# Patient Record
Sex: Female | Born: 1940 | Race: White | Hispanic: No | Marital: Married | Smoking: Former smoker
Health system: Southern US, Community
[De-identification: ages and names within clinical notes are randomized; demographics above are authoritative.]

## PROBLEM LIST (undated history)

## (undated) DIAGNOSIS — I10 Essential (primary) hypertension: Secondary | ICD-10-CM

## (undated) DIAGNOSIS — E785 Hyperlipidemia, unspecified: Secondary | ICD-10-CM

## (undated) DIAGNOSIS — R011 Cardiac murmur, unspecified: Secondary | ICD-10-CM

## (undated) DIAGNOSIS — I6529 Occlusion and stenosis of unspecified carotid artery: Secondary | ICD-10-CM

## (undated) DIAGNOSIS — K5790 Diverticulosis of intestine, part unspecified, without perforation or abscess without bleeding: Secondary | ICD-10-CM

## (undated) DIAGNOSIS — M1990 Unspecified osteoarthritis, unspecified site: Secondary | ICD-10-CM

## (undated) HISTORY — DX: Essential (primary) hypertension: I10

## (undated) HISTORY — DX: Unspecified osteoarthritis, unspecified site: M19.90

## (undated) HISTORY — DX: Cardiac murmur, unspecified: R01.1

## (undated) HISTORY — DX: Hyperlipidemia, unspecified: E78.5

## (undated) HISTORY — DX: Diverticulosis of intestine, part unspecified, without perforation or abscess without bleeding: K57.90

## (undated) HISTORY — DX: Occlusion and stenosis of unspecified carotid artery: I65.29

---

## 1980-04-09 HISTORY — PX: ABDOMINAL HYSTERECTOMY: SHX81

## 2001-04-16 ENCOUNTER — Other Ambulatory Visit: Admission: RE | Admit: 2001-04-16 | Discharge: 2001-04-16 | Payer: Self-pay | Admitting: Obstetrics and Gynecology

## 2003-11-29 ENCOUNTER — Ambulatory Visit (HOSPITAL_COMMUNITY): Admission: RE | Admit: 2003-11-29 | Discharge: 2003-11-29 | Payer: Self-pay | Admitting: Gastroenterology

## 2004-10-13 ENCOUNTER — Inpatient Hospital Stay (HOSPITAL_COMMUNITY): Admission: EM | Admit: 2004-10-13 | Discharge: 2004-10-14 | Payer: Self-pay | Admitting: Emergency Medicine

## 2006-04-09 HISTORY — PX: APPENDECTOMY: SHX54

## 2011-02-19 ENCOUNTER — Other Ambulatory Visit: Payer: Self-pay | Admitting: Family Medicine

## 2013-12-08 ENCOUNTER — Other Ambulatory Visit: Payer: Self-pay | Admitting: Family Medicine

## 2013-12-08 DIAGNOSIS — R221 Localized swelling, mass and lump, neck: Secondary | ICD-10-CM

## 2013-12-09 ENCOUNTER — Ambulatory Visit
Admission: RE | Admit: 2013-12-09 | Discharge: 2013-12-09 | Disposition: A | Discharge status: Home / Self Care | Payer: Medicare Other | Source: Ambulatory Visit | Attending: Family Medicine | Admitting: Family Medicine

## 2013-12-09 DIAGNOSIS — R221 Localized swelling, mass and lump, neck: Secondary | ICD-10-CM

## 2013-12-09 MED ORDER — IOHEXOL 300 MG/ML  SOLN: 75.0000 mL | Freq: Once | INTRAMUSCULAR | Status: AC | PRN

## 2013-12-17 ENCOUNTER — Other Ambulatory Visit: Payer: Self-pay | Admitting: Family Medicine

## 2013-12-17 DIAGNOSIS — I6522 Occlusion and stenosis of left carotid artery: Secondary | ICD-10-CM

## 2013-12-25 ENCOUNTER — Encounter (INDEPENDENT_AMBULATORY_CARE_PROVIDER_SITE_OTHER): Payer: Self-pay

## 2013-12-25 ENCOUNTER — Ambulatory Visit
Admission: RE | Admit: 2013-12-25 | Discharge: 2013-12-25 | Disposition: A | Discharge status: Home / Self Care | Payer: Medicare Other | Source: Ambulatory Visit | Attending: Family Medicine | Admitting: Family Medicine

## 2013-12-25 DIAGNOSIS — I6522 Occlusion and stenosis of left carotid artery: Secondary | ICD-10-CM

## 2014-01-04 ENCOUNTER — Encounter: Payer: Self-pay | Admitting: Surgery

## 2014-01-22 ENCOUNTER — Encounter: Payer: Self-pay | Admitting: Surgery

## 2014-01-25 ENCOUNTER — Encounter: Payer: Self-pay | Admitting: Surgery

## 2014-01-25 ENCOUNTER — Ambulatory Visit (INDEPENDENT_AMBULATORY_CARE_PROVIDER_SITE_OTHER): Payer: Medicare Other | Admitting: Surgery

## 2014-01-25 VITALS — BP 128/66 | HR 72 | Ht 67.0 in | Wt 154.0 lb

## 2014-01-25 DIAGNOSIS — I6529 Occlusion and stenosis of unspecified carotid artery: Secondary | ICD-10-CM

## 2014-01-25 NOTE — Addendum Note (Signed)
Addended by: Mena Goes on: 01/25/2014 01:38 PM   Modules accepted: Orders

## 2014-01-25 NOTE — Progress Notes (Signed)
Patient name: Rachel Bowers MRN: [id number] DOB: 1941/02/15 Sex: female   Referred by: Dr. Drema Dallas  Reason for referral:  Chief Complaint  Patient presents with  . New Evaluation    carotid stenosis    HISTORY OF PRESENT ILLNESS: This is a very pleasant 73 year old female who was referred today for evaluation of left carotid stenosis.  Several months ago the patient felt a lump in her right neck.  During her workup she had a CT scan which showed left carotid calcification.  This led to a carotid duplex.  She was found to have velocity elevations consistent with a 50-69% left carotid stenosis.  There was only trace plaque without stenosis in the right carotid artery.  The patient is asymptomatic.  Specifically, she denies numbness or weakness in either extremity.  She denies slurred speech.  She denies amaurosis fugax.  She does have a history of smoking but quit in the 1970s.  There is a family history of premature cardiac death in her mother who died at age 40 of a massive heart attack.  The patient is medically managed for hypercholesterolemia with a statin.  She is on a ARB for hypertension.  Past Medical History  Diagnosis Date  . Hyperlipidemia   . Hypertension   . Heart murmur     Past Surgical History  Procedure Laterality Date  . Abdominal hysterectomy  1982    History   Social History  . Marital Status: Married    Spouse Name: N/A    Number of Children: N/A  . Years of Education: N/A   Occupational History  . Not on file.   Social History Main Topics  . Smoking status: Former Smoker    Types: Cigarettes    Quit date: 01/25/1974  . Smokeless tobacco: Not on file  . Alcohol Use: 0.6 oz/week    1 Glasses of wine per week  . Drug Use: No  . Sexual Activity: Not on file   Other Topics Concern  . Not on file   Social History Narrative  . No narrative on file    Family History  Problem Relation Age of Onset  . Heart disease Mother   . Hyperlipidemia  Mother   . Hypertension Mother   . Heart attack Mother   . Cancer Brother     Allergies as of 01/25/2014 - Review Complete 01/25/2014  Allergen Reaction Noted  . Penicillins  01/04/2014    Current Outpatient Prescriptions on File Prior to Visit  Medication Sig Dispense Refill  . alendronate (FOSAMAX) 70 MG tablet Take 1 tablet by mouth once a week.      Marland Kitchen amLODipine (NORVASC) 5 MG tablet Take 5 mg by mouth daily.      Marland Kitchen aspirin 81 MG tablet Take 81 mg by mouth daily.      . fluticasone (FLONASE) 50 MCG/ACT nasal spray Place 2 sprays into both nostrils daily.      Marland Kitchen loratadine (CLARITIN) 10 MG tablet Take 10 mg by mouth daily.      Marland Kitchen losartan (COZAAR) 50 MG tablet Take 50 mg by mouth daily.      Marland Kitchen lovastatin (MEVACOR) 20 MG tablet Take 20 mg by mouth at bedtime.      . Omega-3 Fatty Acids (FISH OIL) 1000 MG CAPS Take 1 capsule by mouth daily.       No current facility-administered medications on file prior to visit.     REVIEW OF SYSTEMS: Cardiovascular: No chest  pain, chest pressure, palpitations, orthopnea, or dyspnea on exertion. No claudication or rest pain,  No history of DVT or phlebitis. Pulmonary: No productive cough, asthma or wheezing. Neurologic: No weakness, paresthesias, aphasia, or amaurosis. No dizziness. Hematologic: No bleeding problems or clotting disorders. Musculoskeletal: No joint pain or joint swelling. Gastrointestinal: No blood in stool or hematemesis Genitourinary: No dysuria or hematuria. Psychiatric:: No history of major depression. Integumentary: No rashes or ulcers. Constitutional: No fever or chills.  PHYSICAL EXAMINATION: General: The patient appears their stated age.  Vital signs are BP 128/66  Pulse 72  Ht 5\' 7"  (1.702 m)  Wt 154 lb (69.854 kg)  BMI 24.11 kg/m2  SpO2 99% HEENT:  No gross abnormalities.  Palpable lymph node in the right posterior fossa Pulmonary: Respirations are non-labored Abdomen: Soft and non-tender  Musculoskeletal:  There are no major deformities.   Neurologic: No focal weakness or paresthesias are detected, Skin: There are no ulcer or rashes noted. Psychiatric: The patient has normal affect. Cardiovascular: There is a regular rate and rhythm without significant murmur appreciated.  No carotid bruits.  Palpable pedal pulses.  Diagnostic Studies: I have reviewed her ultrasound which shows no significant right carotid stenosis and 50-69% left carotid stenosis   Assessment:  Left carotid stenosis, asymptomatic Plan: I discussed the signs and symptoms of a TIA vs. stroke.  The patient is asymptomatic.  She will contact me if she develops symptoms.  Otherwise, I'll schedule her for followup in one year with a repeat carotid ultrasound.  She would benefit from a baby aspirin daily.     Eldridge Abrahams, M.D. [facility] Office: [phone] Pager:  [phone]

## 2014-04-28 ENCOUNTER — Other Ambulatory Visit: Payer: Self-pay | Admitting: Obstetrics & Gynecology

## 2015-01-28 ENCOUNTER — Encounter: Payer: Self-pay | Admitting: Family

## 2015-01-31 ENCOUNTER — Ambulatory Visit (HOSPITAL_COMMUNITY)
Admission: RE | Admit: 2015-01-31 | Discharge: 2015-01-31 | Disposition: A | Discharge status: Home / Self Care | Payer: Medicare Other | Source: Ambulatory Visit | Attending: Family | Admitting: Family

## 2015-01-31 ENCOUNTER — Ambulatory Visit (INDEPENDENT_AMBULATORY_CARE_PROVIDER_SITE_OTHER): Payer: Medicare Other | Admitting: Family

## 2015-01-31 ENCOUNTER — Encounter: Payer: Self-pay | Admitting: Family

## 2015-01-31 VITALS — BP 120/73 | HR 73 | Temp 97.2°F | Resp 16 | Ht 66.5 in | Wt 151.0 lb

## 2015-01-31 DIAGNOSIS — I6529 Occlusion and stenosis of unspecified carotid artery: Secondary | ICD-10-CM | POA: Diagnosis not present

## 2015-01-31 DIAGNOSIS — E785 Hyperlipidemia, unspecified: Secondary | ICD-10-CM | POA: Insufficient documentation

## 2015-01-31 DIAGNOSIS — I6523 Occlusion and stenosis of bilateral carotid arteries: Secondary | ICD-10-CM | POA: Insufficient documentation

## 2015-01-31 DIAGNOSIS — I10 Essential (primary) hypertension: Secondary | ICD-10-CM | POA: Insufficient documentation

## 2015-01-31 NOTE — Progress Notes (Signed)
Established Carotid Patient   History of Present Illness  Rachel Bowers is a 74 y.o. female patient of Dr. Trula Slade who was initially referred for evaluation of left carotid stenosis. Several months prior to her initial evaluation the patient felt a lump in her right neck. During her workup she had a CT scan which showed left carotid calcification. This led to a carotid duplex. She was found to have velocity elevations consistent with a 50-69% left carotid stenosis. There was only trace plaque without stenosis in the right carotid artery. The patient denies any history of TIA or stroke symptoms, specifically the patient denies a history of amaurosis fugax or monocular blindness, denies a history unilateral  of facial drooping, denies a history of hemiplegia, and denies a history of receptive or expressive aphasia.    She does have a history of smoking but quit in the 1970s. There is a family history of premature cardiac death in her mother who died at age 62 of a massive heart attack.  The patient is medically managed for hypercholesterolemia with a statin. She is on a ARB for hypertension.  She exercises 3 days/week.  She stopped Fosamax in June 2016 due to right jaw pain and bilateral leg pain, both have mostly resolved since she stopped the Fosamax. 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   The patient denies New Medical or Surgical History.  Pt Diabetic: no Pt smoker: former smoker, quit in the 1970's,  smoked less than a year  Pt meds include: Statin : yes ASA: yes Other anticoagulants/antiplatelets: no   Past Medical History  Diagnosis Date  . Hyperlipidemia   . Hypertension   . Heart murmur   . Carotid artery occlusion     Social History Social History  Substance Use Topics  . Smoking status: Former Smoker    Types: Cigarettes    Quit date: 01/25/1974  . Smokeless tobacco: Never Used  . Alcohol Use: 0.6 oz/week    1 Glasses of wine  per week    Family History Family History  Problem Relation Age of Onset  . Heart disease Mother   . Hyperlipidemia Mother   . Hypertension Mother   . Heart attack Mother   . Cancer Brother     Surgical History Past Surgical History  Procedure Laterality Date  . Abdominal hysterectomy  1982    Allergies  Allergen Reactions  . Penicillins Hives    Current Outpatient Prescriptions  Medication Sig Dispense Refill  . amLODipine (NORVASC) 5 MG tablet Take 5 mg by mouth daily.    Marland Kitchen aspirin 81 MG tablet Take 81 mg by mouth daily.    . fluticasone (FLONASE) 50 MCG/ACT nasal spray Place 2 sprays into both nostrils daily.    Marland Kitchen losartan (COZAAR) 50 MG tablet Take 50 mg by mouth daily.    Marland Kitchen lovastatin (MEVACOR) 20 MG tablet Take 20 mg by mouth at bedtime.    . Omega-3 Fatty Acids (FISH OIL) 1000 MG CAPS Take 1 capsule by mouth daily.    Marland Kitchen alendronate (FOSAMAX) 70 MG tablet Take 1 tablet by mouth once a week.    . loratadine (CLARITIN) 10 MG tablet Take 10 mg by mouth daily.     No current facility-administered medications for this visit.    Review of Systems : See HPI for pertinent positives and negatives.  Physical Examination  Filed Vitals:   01/31/15 1325 01/31/15 1328  BP: 111/67 120/73  Pulse:  74 73  Temp:  97.2 F (36.2 C)  TempSrc:  Oral  Resp:  16  Height:  5' 6.5" (1.689 m)  Weight:  151 lb (68.493 kg)  SpO2:  97%   Body mass index is 24.01 kg/(m^2).  General: WDWN female in NAD GAIT: normal Eyes: PERRLA Pulmonary:  Non-labored, CTAB, no rales, no rhonchi, & no wheezing.  Cardiac: regular rhythm,  no detected murmur.  VASCULAR EXAM Carotid Bruits Right Left   Negative Negative    Aorta is not palpable. Radial pulses are 2+ palpable and equal.                                                                                                                            LE Pulses Right Left       POPLITEAL  not palpable   not palpable       POSTERIOR  TIBIAL  faintly palpable   faintly palpable        DORSALIS PEDIS      ANTERIOR TIBIAL faintly palpable  faintly palpable     Gastrointestinal: soft, nontender, BS WNL, no r/g,  no palpable masses.  Musculoskeletal: no muscle atrophy/wasting. M/S 5/5 throughout, extremities without ischemic changes.  Neurologic: A&O X 3; Appropriate Affect, Speech is normal CN 2-12 intact, pain and light touch intact in extremities, Motor exam as listed above.   Non-Invasive Vascular Imaging CAROTID DUPLEX 01/31/2015   CEREBROVASCULAR DUPLEX EVALUATION    INDICATION: Carotid artery disease    PREVIOUS INTERVENTION(S): None    DUPLEX EXAM: Carotid duplex    RIGHT  LEFT  Peak Systolic Velocities (cm/s) End Diastolic Velocities (cm/s) Plaque LOCATION Peak Systolic Velocities (cm/s) End Diastolic Velocities (cm/s) Plaque  94 19 - CCA PROXIMAL 128 23 -  70 19 - CCA MID 90 19 -  68 15 - CCA DISTAL 72 15 HT  84 12 - ECA 85 10 -  63 19 HM ICA PROXIMAL 148 44 CP  82 29 - ICA MID 137 36 -  67 24 - ICA DISTAL 71 22 -    1.1 ICA / CCA Ratio (PSV) 1.6  Antegrade Vertebral Flow Antegrade  202 Brachial Systolic Pressure (mmHg) 542  Triphasic Brachial Artery Waveforms Triphasic    Plaque Morphology:  HM = Homogeneous, HT = Heterogeneous, CP = Calcific Plaque, SP = Smooth Plaque, IP = Irregular Plaque     ADDITIONAL FINDINGS:     IMPRESSION: 1. Less than 40% right internal carotid artery stenosis. 2. 40 - 59% left internal carotid artery stenosis.    Compared to the previous exam:  No significant change since exam of 12/25/2013       Assessment: Rachel Bowers is a 74 y.o. female who has no history of stroke or TIA. Today's carotid duplex suggests less than 40% right internal carotid artery stenosis and 40 - 59% left internal carotid artery stenosis. No significant change since exam of 12/25/2013.   Plan: Follow-up  in 1 year with Carotid Duplex scan.   I discussed in depth with the  patient the nature of atherosclerosis, and emphasized the importance of maximal medical management including strict control of blood pressure, blood glucose, and lipid levels, obtaining regular exercise, and continued cessation of smoking.  The patient is aware that without maximal medical management the underlying atherosclerotic disease process will progress, limiting the benefit of any interventions. The patient was given information about stroke prevention and what symptoms should prompt the patient to seek immediate medical care. Thank you for allowing [place] to participate in this patient's care.  Clemon Chambers, RN, MSN, FNP-C [facility] Office: [facility] Physician: Trula Slade  01/31/2015 1:31 PM

## 2015-01-31 NOTE — Patient Instructions (Signed)
Stroke Prevention Some medical conditions and behaviors are associated with an increased chance of having a stroke. You may prevent a stroke by making healthy choices and managing medical conditions. HOW CAN I REDUCE MY RISK OF HAVING A STROKE?   Stay physically active. Get at least 30 minutes of activity on most or all days.  Do not smoke. It may also be helpful to avoid exposure to secondhand smoke.  Limit alcohol use. Moderate alcohol use is considered to be:  No more than 2 drinks per day for men.  No more than 1 drink per day for nonpregnant women.  Eat healthy foods. This involves:  Eating 5 or more servings of fruits and vegetables a day.  Making dietary changes that address high blood pressure (hypertension), high cholesterol, diabetes, or obesity.  Manage your cholesterol levels.  Making food choices that are high in fiber and low in saturated fat, trans fat, and cholesterol may control cholesterol levels.  Take any prescribed medicines to control cholesterol as directed by your health care provider.  Manage your diabetes.  Controlling your carbohydrate and sugar intake is recommended to manage diabetes.  Take any prescribed medicines to control diabetes as directed by your health care provider.  Control your hypertension.  Making food choices that are low in salt (sodium), saturated fat, trans fat, and cholesterol is recommended to manage hypertension.  Ask your health care provider if you need treatment to lower your blood pressure. Take any prescribed medicines to control hypertension as directed by your health care provider.  If you are 18-39 years of age, have your blood pressure checked every 3-5 years. If you are 40 years of age or older, have your blood pressure checked every year.  Maintain a healthy weight.  Reducing calorie intake and making food choices that are low in sodium, saturated fat, trans fat, and cholesterol are recommended to manage  weight.  Stop drug abuse.  Avoid taking birth control pills.  Talk to your health care provider about the risks of taking birth control pills if you are over 35 years old, smoke, get migraines, or have ever had a blood clot.  Get evaluated for sleep disorders (sleep apnea).  Talk to your health care provider about getting a sleep evaluation if you snore a lot or have excessive sleepiness.  Take medicines only as directed by your health care provider.  For some people, aspirin or blood thinners (anticoagulants) are helpful in reducing the risk of forming abnormal blood clots that can lead to stroke. If you have the irregular heart rhythm of atrial fibrillation, you should be on a blood thinner unless there is a good reason you cannot take them.  Understand all your medicine instructions.  Make sure that other conditions (such as anemia or atherosclerosis) are addressed. SEEK IMMEDIATE MEDICAL CARE IF:   You have sudden weakness or numbness of the face, arm, or leg, especially on one side of the body.  Your face or eyelid droops to one side.  You have sudden confusion.  You have trouble speaking (aphasia) or understanding.  You have sudden trouble seeing in one or both eyes.  You have sudden trouble walking.  You have dizziness.  You have a loss of balance or coordination.  You have a sudden, severe headache with no known cause.  You have new chest pain or an irregular heartbeat. Any of these symptoms may represent a serious problem that is an emergency. Do not wait to see if the symptoms will   go away. Get medical help at once. Call your local emergency services (911 in [place] Do not drive yourself to the hospital.   This information is not intended to replace advice given to you by your health care provider. Make sure you discuss any questions you have with your health care provider.   Document Released: 05/03/2004 Document Revised: 04/16/2014 Document Reviewed:  09/26/2012 Elsevier Interactive Patient Education 2016 Elsevier Inc.  

## 2015-02-01 NOTE — Addendum Note (Signed)
Addended by: Dorthula Rue L on: 02/01/2015 10:04 AM   Modules accepted: Orders

## 2015-03-14 ENCOUNTER — Encounter: Payer: Self-pay | Admitting: Primary Care

## 2015-03-14 ENCOUNTER — Ambulatory Visit: Payer: Medicare Other | Admitting: Primary Care

## 2015-03-14 ENCOUNTER — Ambulatory Visit (INDEPENDENT_AMBULATORY_CARE_PROVIDER_SITE_OTHER): Payer: Medicare Other | Admitting: Primary Care

## 2015-03-14 VITALS — BP 118/72 | HR 67 | Temp 97.3°F | Ht 67.5 in | Wt 155.0 lb

## 2015-03-14 DIAGNOSIS — E785 Hyperlipidemia, unspecified: Secondary | ICD-10-CM | POA: Diagnosis not present

## 2015-03-14 DIAGNOSIS — I6529 Occlusion and stenosis of unspecified carotid artery: Secondary | ICD-10-CM | POA: Diagnosis not present

## 2015-03-14 DIAGNOSIS — I10 Essential (primary) hypertension: Secondary | ICD-10-CM | POA: Diagnosis not present

## 2015-03-14 NOTE — Assessment & Plan Note (Signed)
Currently managed on lovastatin. Lipid panel from November 2016 reviewed and is stable. Denies myalgias. LFT's WNL. Repeat in 1 year.

## 2015-03-14 NOTE — Assessment & Plan Note (Signed)
Currently managed on Aspirin 81 mg. Recent carotid US per patient. [place] from 2015 reviewed. Will obtain records.

## 2015-03-14 NOTE — Progress Notes (Signed)
Pre visit review using our clinic review tool, if applicable. No additional management support is needed unless otherwise documented below in the visit note. 

## 2015-03-14 NOTE — Assessment & Plan Note (Signed)
Diagnosed 5 years ago. Currently managed on amlodipine 5 mg and losartan 50 mg. Continue current regimen. BP stable today. BMP from 03/09/15 stable.

## 2015-03-14 NOTE — Patient Instructions (Signed)
Follow up in 6 months for re-evaluation.  Please schedule a physical with me in December 2017. You will also schedule a lab only appointment one week prior. We will discuss your lab results during your physical.  It was a pleasure to meet you today! Please don't hesitate to call me with any questions. Welcome to [facility]!

## 2015-03-14 NOTE — Progress Notes (Signed)
Subjective:    Patient ID: Rachel Bowers, female    DOB: 09-09-1940, 74 y.o.   MRN: [id number]  HPI  [name] is a 74 year old female who presents today to establish care and discuss the problems mentioned below. Will obtain old records. Her last physical was in November 2016. She has labs from physical on 03/09/15.  1) Essential Hypertension: Currently managed on Amlodipine 5 mg and losartan 50 mg. Diagnosed 5 years ago. Denies headaches, chest pain, dizziness. BMP WNL from current labs.  2) Hyperlipidemia: Currently managed on lovastatin 20 mg and Fish Oil.  Diagnosed within the last 5 years ago. Denies myalgias. Lipid panel from 03/09/15 with TC of 185, trigs 101, LDL 94.  3) Carotid stenosis: Currently managed on aspirin 81 mg. She had a carotid ultrasound recently with right side 40% blockage and left side 50 % blockage. This is monitored annually.    Review of Systems  Constitutional: Negative for unexpected weight change.  HENT: Negative for rhinorrhea.   Respiratory: Negative for cough and shortness of breath.   Cardiovascular: Negative for chest pain.  Gastrointestinal: Negative for diarrhea and constipation.  Musculoskeletal: Positive for arthralgias. Negative for myalgias.       Joint aches to fingers, knees, hips  Skin: Negative for rash.  Allergic/Immunologic: Positive for environmental allergies.  Neurological: Negative for dizziness, numbness and headaches.  Psychiatric/Behavioral:       Denies concerns for anxiety or depression       Past Medical History  Diagnosis Date  . Hyperlipidemia   . Hypertension   . Heart murmur   . Carotid artery occlusion   . Arthritis     Social History   Social History  . Marital Status: Married    Spouse Name: N/A  . Number of Children: N/A  . Years of Education: N/A   Occupational History  . Not on file.   Social History Main Topics  . Smoking status: Former Smoker    Types: Cigarettes    Quit date: 01/25/1974  .  Smokeless tobacco: Never Used  . Alcohol Use: 0.6 oz/week    1 Glasses of wine per week  . Drug Use: No  . Sexual Activity: Not on file   Other Topics Concern  . Not on file   Social History Narrative   Married.   1 daughter and 1 son. 2 grandchildren.   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   Enjoys exercising at the [facility], reading.        Past Surgical History  Procedure Laterality Date  . Abdominal hysterectomy  1982  . Appendectomy  2008    Family History  Problem Relation Age of Onset  . Heart disease Mother   . Hyperlipidemia Mother   . Hypertension Mother   . Heart attack Mother   . Lung cancer Brother     Allergies  Allergen Reactions  . Penicillins Hives    Current Outpatient Prescriptions on File Prior to Visit  Medication Sig Dispense Refill  . aspirin 81 MG tablet Take 81 mg by mouth daily.    Marland Kitchen losartan (COZAAR) 50 MG tablet Take 50 mg by mouth daily.    Marland Kitchen lovastatin (MEVACOR) 20 MG tablet Take 20 mg by mouth at bedtime.    . Omega-3 Fatty Acids (FISH OIL) 1000 MG CAPS Take 1 capsule by mouth daily.    Marland Kitchen amLODipine (NORVASC) 5 MG tablet Take 5 mg by  mouth daily.     No current facility-administered medications on file prior to visit.    BP 118/72 mmHg  Pulse 67  Temp(Src) 97.3 F (36.3 C) (Oral)  Ht 5' 7.5" (1.715 m)  Wt 155 lb (70.308 kg)  BMI 23.90 kg/m2  SpO2 93%    Objective:   Physical Exam  Constitutional: She is oriented to person, place, and time. She appears well-nourished.  Neck: Carotid bruit is not present.  Cardiovascular: Normal rate and regular rhythm.   Pulmonary/Chest: Effort normal and breath sounds normal.  Neurological: She is alert and oriented to person, place, and time.  Skin: Skin is warm and dry.  Psychiatric: She has a normal mood and affect.          Assessment & Plan:

## 2015-03-30 ENCOUNTER — Encounter: Payer: Self-pay | Admitting: Primary Care

## 2015-04-12 ENCOUNTER — Ambulatory Visit (INDEPENDENT_AMBULATORY_CARE_PROVIDER_SITE_OTHER): Payer: Medicare Other | Admitting: Primary Care

## 2015-04-12 ENCOUNTER — Encounter: Payer: Self-pay | Admitting: Primary Care

## 2015-04-12 VITALS — BP 116/76 | HR 68 | Temp 97.6°F | Ht 67.0 in | Wt 154.0 lb

## 2015-04-12 DIAGNOSIS — M8580 Other specified disorders of bone density and structure, unspecified site: Secondary | ICD-10-CM | POA: Diagnosis not present

## 2015-04-12 NOTE — Patient Instructions (Addendum)
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  Continue taking Calcium and vitamin D supplements as discussed.  Continue with weight bearing exercises to help strengthen bones.  Nasal Congestion: Try taking Zyrtec, Claritin 24 hour, or Allegra 24 hour medication at bedtime. Generic medication is fine.  Follow up in November 2017 or annual wellness visit or sooner if needed.  It was a pleasure to see you today!

## 2015-04-12 NOTE — Progress Notes (Signed)
Subjective:    Patient ID: Rachel Bowers, female    DOB: [birth year], 75 y.o.   MRN: [id number]  HPI  Rachel Bowers is a 75 year old female who presents today to discuss bone density testing results. She underwent bone density testing on 03/16/15. Her testing was positive for osteopenia. She was managed on Fosamax for 5 years and stopped taking June 2016 due to completing 5 years and also due to joint aches. She would like to know if she should resume Fosamax. She is currently managed on calcium with vitamin D and is doing weight bearing exercises. She leads an active lifestyle.   Review of Systems  Respiratory: Negative for shortness of breath.   Cardiovascular: Negative for chest pain.  Musculoskeletal: Negative for myalgias and arthralgias.       Past Medical History  Diagnosis Date  . Hyperlipidemia   . Hypertension   . Heart murmur   . Carotid artery occlusion     40-49%  . Arthritis   . Diverticulosis     per colonoscopy    Social History   Social History  . Marital Status: Married    Spouse Name: N/A  . Number of Children: N/A  . Years of Education: N/A   Occupational History  . Not on file.   Social History Main Topics  . Smoking status: Former Smoker    Types: Cigarettes    Quit date: 01/25/1974  . Smokeless tobacco: Never Used  . Alcohol Use: 0.6 oz/week    1 Glasses of wine per week  . Drug Use: No  . Sexual Activity: Not on file   Other Topics Concern  . Not on file   Social History Narrative   Married.   1 daughter and 1 son. 2 grandchildren.   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   Enjoys exercising at the [facility], reading.        Past Surgical History  Procedure Laterality Date  . Abdominal hysterectomy  1982  . Appendectomy  2008    Family History  Problem Relation Age of Onset  . Heart disease Mother   . Hyperlipidemia Mother   . Hypertension Mother   . Heart attack Mother   . Lung cancer  Brother     Allergies  Allergen Reactions  . Penicillins Hives    Current Outpatient Prescriptions on File Prior to Visit  Medication Sig Dispense Refill  . amLODipine (NORVASC) 5 MG tablet Take 5 mg by mouth daily.    Marland Kitchen aspirin 81 MG tablet Take 81 mg by mouth daily.    Marland Kitchen losartan (COZAAR) 50 MG tablet Take 50 mg by mouth daily.    Marland Kitchen lovastatin (MEVACOR) 20 MG tablet Take 20 mg by mouth at bedtime.    . Omega-3 Fatty Acids (FISH OIL) 1000 MG CAPS Take 1 capsule by mouth daily.     No current facility-administered medications on file prior to visit.    BP 116/76 mmHg  Pulse 68  Temp(Src) 97.6 F (36.4 C) (Oral)  Ht 5\' 7"  (1.702 m)  Wt 154 lb (69.854 kg)  BMI 24.11 kg/m2  SpO2 97%    Objective:   Physical Exam  Constitutional: She appears well-nourished.  Cardiovascular: Normal rate and regular rhythm.   Pulmonary/Chest: Effort normal and breath sounds normal.  Skin: Skin is warm and dry.          Assessment & Plan:  Osteopenia:  Present on  Bone Density testing from 03/2015. Once managed on Fosamax for 5 years, stopped taking in June 2016. Advised she stay off Fosamax, continue Calcium 1200 mg with vitamin D 1000 units. Discussed importance of active lifestyle and weight bearing exercises to help strengthen bones.  Will repeat bone density testing in 2-3 years.

## 2015-04-12 NOTE — Progress Notes (Signed)
Pre visit review using our clinic review tool, if applicable. No additional management support is needed unless otherwise documented below in the visit note. 

## 2015-07-20 ENCOUNTER — Other Ambulatory Visit: Payer: Self-pay | Admitting: *Deleted

## 2015-07-20 MED ORDER — LOVASTATIN 20 MG PO TABS: 20.0000 mg | ORAL_TABLET | Freq: Every day | ORAL | Status: DC

## 2015-09-20 ENCOUNTER — Encounter: Payer: Self-pay | Admitting: Primary Care

## 2015-09-20 ENCOUNTER — Ambulatory Visit (INDEPENDENT_AMBULATORY_CARE_PROVIDER_SITE_OTHER)
Admission: RE | Admit: 2015-09-20 | Discharge: 2015-09-20 | Disposition: A | Discharge status: Home / Self Care | Payer: Medicare Other | Source: Ambulatory Visit | Attending: Primary Care | Admitting: Primary Care

## 2015-09-20 ENCOUNTER — Ambulatory Visit (INDEPENDENT_AMBULATORY_CARE_PROVIDER_SITE_OTHER): Payer: Medicare Other | Admitting: Primary Care

## 2015-09-20 VITALS — BP 124/72 | HR 65 | Temp 97.5°F | Ht 67.0 in | Wt 152.0 lb

## 2015-09-20 DIAGNOSIS — M545 Low back pain: Secondary | ICD-10-CM | POA: Diagnosis not present

## 2015-09-20 DIAGNOSIS — M47816 Spondylosis without myelopathy or radiculopathy, lumbar region: Secondary | ICD-10-CM | POA: Diagnosis not present

## 2015-09-20 MED ORDER — CYCLOBENZAPRINE HCL 5 MG PO TABS: 5.0000 mg | ORAL_TABLET | Freq: Three times a day (TID) | ORAL | Status: DC | PRN

## 2015-09-20 NOTE — Progress Notes (Signed)
Pre visit review using our clinic review tool, if applicable. No additional management support is needed unless otherwise documented below in the visit note. 

## 2015-09-20 NOTE — Patient Instructions (Signed)
Complete xray(s) prior to leaving today. I will notify you of your results once received.  You may try the Cyclobenzaprine muscle relaxer for discomfort to your back. Take 1 tablet by mouth no more than three times daily as needed. This medication may cause drowsiness, so try taking at bedtime first.  Do not exceed 2400 mg of ibuprofen daily. Most people will take 600-800 mg of ibuprofen three times daily as needed.  Refrain from overexertion for the next 1-2 weeks.   Apply heat to your back three times daily for about 20-30 minutes at a time.  Please notify me if no improvement in 1-2 weeks.  It was a pleasure to see you today!  Back Pain, Adult Back pain is very common in adults.The cause of back pain is rarely dangerous and the pain often gets better over time.The cause of your back pain may not be known. Some common causes of back pain include:  Strain of the muscles or ligaments supporting the spine.  Wear and tear (degeneration) of the spinal disks.  Arthritis.  Direct injury to the back. For many people, back pain may return. Since back pain is rarely dangerous, most people can learn to manage this condition on their own. HOME CARE INSTRUCTIONS Watch your back pain for any changes. The following actions may help to lessen any discomfort you are feeling:  Remain active. It is stressful on your back to sit or stand in one place for long periods of time. Do not sit, drive, or stand in one place for more than 30 minutes at a time. Take short walks on even surfaces as soon as you are able.Try to increase the length of time you walk each day.  Exercise regularly as directed by your health care provider. Exercise helps your back heal faster. It also helps avoid future injury by keeping your muscles strong and flexible.  Do not stay in bed.Resting more than 1-2 days can delay your recovery.  Pay attention to your body when you bend and lift. The most comfortable positions are  those that put less stress on your recovering back. Always use proper lifting techniques, including:  Bending your knees.  Keeping the load close to your body.  Avoiding twisting.  Find a comfortable position to sleep. Use a firm mattress and lie on your side with your knees slightly bent. If you lie on your back, put a pillow under your knees.  Avoid feeling anxious or stressed.Stress increases muscle tension and can worsen back pain.It is important to recognize when you are anxious or stressed and learn ways to manage it, such as with exercise.  Take medicines only as directed by your health care provider. Over-the-counter medicines to reduce pain and inflammation are often the most helpful.Your health care provider may prescribe muscle relaxant drugs.These medicines help dull your pain so you can more quickly return to your normal activities and healthy exercise.  Apply ice to the injured area:  Put ice in a plastic bag.  Place a towel between your skin and the bag.  Leave the ice on for 20 minutes, 2-3 times a day for the first 2-3 days. After that, ice and heat may be alternated to reduce pain and spasms.  Maintain a healthy weight. Excess weight puts extra stress on your back and makes it difficult to maintain good posture. SEEK MEDICAL CARE IF:  You have pain that is not relieved with rest or medicine.  You have increasing pain going down into the  legs or buttocks.  You have pain that does not improve in one week.  You have night pain.  You lose weight.  You have a fever or chills. SEEK IMMEDIATE MEDICAL CARE IF:   You develop new bowel or bladder control problems.  You have unusual weakness or numbness in your arms or legs.  You develop nausea or vomiting.  You develop abdominal pain.  You feel faint.   This information is not intended to replace advice given to you by your health care provider. Make sure you discuss any questions you have with your health  care provider.   Document Released: 03/26/2005 Document Revised: 04/16/2014 Document Reviewed: 07/28/2013 Elsevier Interactive Patient Education [employer].

## 2015-09-20 NOTE — Progress Notes (Signed)
Subjective:    Patient ID: Rachel Bowers, female    DOB: 04-18-1940, 75 y.o.   MRN: [id number]  HPI  Rachel Bowers is a 75 year old female who presents today with a chief complaint of back pain. Her pain is located to the the lower left side of her back and has been present since Friday last week.   She's been more active in her yard over the past 1 month as she has had to take over for her husband who is ill. She was digging holes in her yard last week which is not her typical activity. Her pain became worse Friday after reaching to grab something off of the floor, and again later that day after she was cleaning her house. She also experienced pain Saturday while doing housework.   She denies numbness, tingling to her lower back or left lower extremities. She describes her pain as sharp and is intermittent. She's been taking Advil with improvement in her pain.   Review of Systems  Musculoskeletal: Positive for back pain.  Skin: Negative for color change.  Neurological: Negative for numbness.       Past Medical History  Diagnosis Date  . Hyperlipidemia   . Hypertension   . Heart murmur   . Carotid artery occlusion     40-49%  . Arthritis   . Diverticulosis     per colonoscopy     Social History   Social History  . Marital Status: Married    Spouse Name: N/A  . Number of Children: N/A  . Years of Education: N/A   Occupational History  . Not on file.   Social History Main Topics  . Smoking status: Former Smoker    Types: Cigarettes    Quit date: 01/25/1974  . Smokeless tobacco: Never Used  . Alcohol Use: 0.6 oz/week    1 Glasses of wine per week  . Drug Use: No  . Sexual Activity: Not on file   Other Topics Concern  . Not on file   Social History Narrative   Married.   1 daughter and 1 son. 2 grandchildren.   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   Enjoys exercising at the [facility], reading.        Past Surgical  History  Procedure Laterality Date  . Abdominal hysterectomy  1982  . Appendectomy  2008    Family History  Problem Relation Age of Onset  . Heart disease Mother   . Hyperlipidemia Mother   . Hypertension Mother   . Heart attack Mother   . Lung cancer Brother     Allergies  Allergen Reactions  . Penicillins Hives    Current Outpatient Prescriptions on File Prior to Visit  Medication Sig Dispense Refill  . amLODipine (NORVASC) 5 MG tablet Take 5 mg by mouth daily.    Marland Kitchen aspirin 81 MG tablet Take 81 mg by mouth daily.    Marland Kitchen losartan (COZAAR) 50 MG tablet Take 50 mg by mouth daily.    Marland Kitchen lovastatin (MEVACOR) 20 MG tablet Take 1 tablet (20 mg total) by mouth at bedtime. 90 tablet 1  . Omega-3 Fatty Acids (FISH OIL) 1000 MG CAPS Take 1 capsule by mouth daily.     No current facility-administered medications on file prior to visit.    BP 124/72 mmHg  Pulse 65  Temp(Src) 97.5 F (36.4 C) (Oral)  Ht 5\' 7"  (1.702 m)  Wt 152  lb (68.947 kg)  BMI 23.80 kg/m2  SpO2 95%    Objective:   Physical Exam  Constitutional: She appears well-nourished.  Cardiovascular: Normal rate and regular rhythm.   Pulmonary/Chest: Effort normal and breath sounds normal.  Musculoskeletal:       Lumbar back: She exhibits decreased range of motion and pain. She exhibits no tenderness and no deformity.  Skin: Skin is warm and dry.          Assessment & Plan:  Low back pain:  Located to left lower back since increased activity in her yard. Suspect muscle involvement at this point as x-ray is unremarkable and pain is aggravated with certain movements. No numbness or tingling. Will have her try low-dose Flexeril as needed, continue ibuprofen as needed, apply heat, remain active without overexertion to prevent stiffness. She is to notify me if no improvement in 1-2 weeks.

## 2015-12-30 ENCOUNTER — Ambulatory Visit (INDEPENDENT_AMBULATORY_CARE_PROVIDER_SITE_OTHER): Payer: Medicare Other

## 2015-12-30 ENCOUNTER — Ambulatory Visit: Payer: Medicare Other

## 2015-12-30 DIAGNOSIS — Z23 Encounter for immunization: Secondary | ICD-10-CM | POA: Diagnosis not present

## 2016-01-25 ENCOUNTER — Other Ambulatory Visit: Payer: Self-pay | Admitting: Primary Care

## 2016-02-06 ENCOUNTER — Ambulatory Visit: Payer: Medicare Other | Admitting: Family

## 2016-02-06 ENCOUNTER — Encounter (HOSPITAL_COMMUNITY): Payer: Medicare Other

## 2016-02-08 ENCOUNTER — Encounter: Payer: Self-pay | Admitting: Family

## 2016-02-13 ENCOUNTER — Encounter: Payer: Self-pay | Admitting: Family

## 2016-02-13 ENCOUNTER — Ambulatory Visit (INDEPENDENT_AMBULATORY_CARE_PROVIDER_SITE_OTHER): Payer: Medicare Other | Admitting: Family

## 2016-02-13 ENCOUNTER — Ambulatory Visit (HOSPITAL_COMMUNITY)
Admission: RE | Admit: 2016-02-13 | Discharge: 2016-02-13 | Disposition: A | Discharge status: Home / Self Care | Payer: Medicare Other | Source: Ambulatory Visit | Attending: Family | Admitting: Family

## 2016-02-13 VITALS — BP 130/63 | HR 72 | Temp 97.2°F | Ht 66.0 in | Wt 151.0 lb

## 2016-02-13 DIAGNOSIS — I6523 Occlusion and stenosis of bilateral carotid arteries: Secondary | ICD-10-CM

## 2016-02-13 LAB — VAS US CAROTID
LCCAPDIAS: 23 cm/s
LEFT ECA DIAS: -11 cm/s
LICADDIAS: -20 cm/s
LICAPDIAS: 35 cm/s
Left CCA dist dias: 19 cm/s
Left CCA dist sys: 65 cm/s
Left CCA prox sys: 116 cm/s
Left ICA dist sys: -56 cm/s
Left ICA prox sys: 126 cm/s
RCCAPDIAS: 13 cm/s
RIGHT CCA MID DIAS: -17 cm/s
RIGHT ECA DIAS: -12 cm/s
Right CCA prox sys: 80 cm/s
Right cca dist sys: -66 cm/s

## 2016-02-13 NOTE — Progress Notes (Signed)
Chief Complaint: Follow up Extracranial Carotid Artery Stenosis   History of Present Illness  Rachel Bowers is a 75 y.o. female patient of Dr. Trula Slade who was initially referred for evaluation of left carotid stenosis. Several months prior to her initial evaluation the patient felt a lump in her right neck. During her workup she had a CT scan which showed left carotid calcification. This led to a carotid duplex. She was found to have velocity elevations consistent with a 50-69% left carotid stenosis. There was only trace plaque without stenosis in the right carotid artery. The patient denies any history of TIA or stroke symptoms, specifically the patient denies a history of amaurosis fugax or monocular blindness, denies a history unilateral  of facial drooping, denies a history of hemiplegia, and denies a history of receptive or expressive aphasia.    She does have a history of smoking but quit in the 1970s. There is a family history of premature cardiac death in her mother who died at age 37 of a massive heart attack.  The patient is medically managed for hypercholesterolemia with a statin. She is on a ARB for hypertension.  She was exercising 3 days/week, but since about .  She stopped Fosamax in June 2016 due to right jaw pain and bilateral leg pain, both have mostly resolved since she stopped the Fosamax. 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   The patient denies New Medical or Surgical History.  Pt Diabetic: no Pt smoker: former smoker, quit in the 1970's,  smoked less than a year  Pt meds include: Statin : yes ASA: yes Other anticoagulants/antiplatelets: no   Past Medical History:  Diagnosis Date  . Arthritis   . 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 Diverticulosis    per colonoscopy  . Heart murmur   . Hyperlipidemia   . Hypertension     Social History Social History  Substance Use Topics  . Smoking status: Former  Smoker    Types: Cigarettes    Quit date: 01/25/1974  . Smokeless tobacco: Never Used  . Alcohol use 0.6 oz/week    1 Glasses of wine per week    Family History Family History  Problem Relation Age of Onset  . Heart disease Mother   . Hyperlipidemia Mother   . Hypertension Mother   . Heart attack Mother   . Lung cancer Brother     Surgical History Past Surgical History:  Procedure Laterality Date  . ABDOMINAL HYSTERECTOMY  1982  . APPENDECTOMY  2008    Allergies  Allergen Reactions  . Penicillins Hives    Current Outpatient Prescriptions  Medication Sig Dispense Refill  . amLODipine (NORVASC) 5 MG tablet Take 5 mg by mouth daily.    Marland Kitchen aspirin 81 MG tablet Take 81 mg by mouth daily.    . Calcium Carbonate-Vitamin D (CALCIUM-VITAMIN D3 PO) Take 1,000 mg by mouth daily.    . cyclobenzaprine (FLEXERIL) 5 MG tablet Take 1 tablet (5 mg total) by mouth 3 (three) times daily as needed for muscle spasms. 30 tablet 0  . losartan (COZAAR) 50 MG tablet Take 50 mg by mouth daily.    Marland Kitchen lovastatin (MEVACOR) 20 MG tablet TAKE 1 TABLET BY MOUTH AT  BEDTIME 90 tablet 1  . Magnesium 500 MG CAPS Take 500 mg by mouth daily.    . Omega-3 Fatty Acids (FISH OIL) 1000 MG CAPS Take 1 capsule by mouth daily.  No current facility-administered medications for this visit.     Review of Systems : See HPI for pertinent positives and negatives.  Physical Examination  Vitals:   02/13/16 1040  BP: 130/63  Pulse: 72  Temp: 97.2 F (36.2 C)  SpO2: 96%  Weight: 151 lb (68.5 kg)  Height: 5\' 6"  (1.676 m)   Body mass index is 24.37 kg/m.  General: WDWN female in NAD GAIT: normal Eyes: PERRLA Pulmonary: Respirations are non-labored, CTAB, good air movement in all fields, no rales, rhonchi, or wheezing.  Cardiac: regular rhythm, no detected murmur.  VASCULAR EXAM Carotid Bruits Right Left   Negative Negative    Aorta is not palpable. Radial pulses are 2+ palpable and equal.                                                                                                                                           LE Pulses Right Left       POPLITEAL  not palpable  not palpable       POSTERIOR TIBIAL  faintly palpable  faintly palpable       DORSALIS PEDIS      ANTERIOR TIBIAL faintly palpable faintly palpable    Gastrointestinal: soft, nontender, BS WNL, no r/g,  no palpable masses.  Musculoskeletal: no muscle atrophy/wasting. M/S 5/5 throughout, extremities without ischemic changes.  Neurologic: A&O X 3; Appropriate Affect, Speech is normal CN 2-12 intact, pain and light touch intact in extremities, Motor exam as listed above.     Assessment: Rachel Bowers is a 75 y.o. female who has no history of stroke or TIA.  DATA Today's carotid duplex suggests less than 40% right internal carotid artery stenosis and <40% (closer to 40%) left internal carotid artery stenosis. No significant stenosis of the bilateral CCA or ECA. Bilateral vertebral artery flow is antegrade. Bilateral subclavian artery waveforms are multiphasic. No significant change since the last exam on 01/31/15.    Plan: Follow-up in 1 year with Carotid Duplex scan.  I discussed in depth with the patient the nature of atherosclerosis, and emphasized the importance of maximal medical management including strict control of blood pressure, blood glucose, and lipid levels, obtaining regular exercise, and continued cessation of smoking.  The patient is aware that without maximal medical management the underlying atherosclerotic disease process will progress, limiting the benefit of any interventions. The patient was given information about stroke prevention and what symptoms should prompt the patient to seek immediate medical care. Thank you for allowing [place] to participate in this patient's care.  Clemon Chambers, RN, MSN, FNP-C [facility] Office: [facility]  Physician: Trula Slade  02/13/16 11:06 AM

## 2016-02-13 NOTE — Patient Instructions (Signed)
Stroke Prevention Some medical conditions and behaviors are associated with an increased chance of having a stroke. You may prevent a stroke by making healthy choices and managing medical conditions. HOW CAN I REDUCE MY RISK OF HAVING A STROKE?   Stay physically active. Get at least 30 minutes of activity on most or all days.  Do not smoke. It may also be helpful to avoid exposure to secondhand smoke.  Limit alcohol use. Moderate alcohol use is considered to be:  No more than 2 drinks per day for men.  No more than 1 drink per day for nonpregnant women.  Eat healthy foods. This involves:  Eating 5 or more servings of fruits and vegetables a day.  Making dietary changes that address high blood pressure (hypertension), high cholesterol, diabetes, or obesity.  Manage your cholesterol levels.  Making food choices that are high in fiber and low in saturated fat, trans fat, and cholesterol may control cholesterol levels.  Take any prescribed medicines to control cholesterol as directed by your health care provider.  Manage your diabetes.  Controlling your carbohydrate and sugar intake is recommended to manage diabetes.  Take any prescribed medicines to control diabetes as directed by your health care provider.  Control your hypertension.  Making food choices that are low in salt (sodium), saturated fat, trans fat, and cholesterol is recommended to manage hypertension.  Ask your health care provider if you need treatment to lower your blood pressure. Take any prescribed medicines to control hypertension as directed by your health care provider.  If you are 18-39 years of age, have your blood pressure checked every 3-5 years. If you are 40 years of age or older, have your blood pressure checked every year.  Maintain a healthy weight.  Reducing calorie intake and making food choices that are low in sodium, saturated fat, trans fat, and cholesterol are recommended to manage  weight.  Stop drug abuse.  Avoid taking birth control pills.  Talk to your health care provider about the risks of taking birth control pills if you are over 35 years old, smoke, get migraines, or have ever had a blood clot.  Get evaluated for sleep disorders (sleep apnea).  Talk to your health care provider about getting a sleep evaluation if you snore a lot or have excessive sleepiness.  Take medicines only as directed by your health care provider.  For some people, aspirin or blood thinners (anticoagulants) are helpful in reducing the risk of forming abnormal blood clots that can lead to stroke. If you have the irregular heart rhythm of atrial fibrillation, you should be on a blood thinner unless there is a good reason you cannot take them.  Understand all your medicine instructions.  Make sure that other conditions (such as anemia or atherosclerosis) are addressed. SEEK IMMEDIATE MEDICAL CARE IF:   You have sudden weakness or numbness of the face, arm, or leg, especially on one side of the body.  Your face or eyelid droops to one side.  You have sudden confusion.  You have trouble speaking (aphasia) or understanding.  You have sudden trouble seeing in one or both eyes.  You have sudden trouble walking.  You have dizziness.  You have a loss of balance or coordination.  You have a sudden, severe headache with no known cause.  You have new chest pain or an irregular heartbeat. Any of these symptoms may represent a serious problem that is an emergency. Do not wait to see if the symptoms will   go away. Get medical help at once. Call your local emergency services (911 in [place] Do not drive yourself to the hospital.   This information is not intended to replace advice given to you by your health care provider. Make sure you discuss any questions you have with your health care provider.   Document Released: 05/03/2004 Document Revised: 04/16/2014 Document Reviewed:  09/26/2012 Elsevier Interactive Patient Education 2016 Elsevier Inc.  

## 2016-02-28 DIAGNOSIS — Z1231 Encounter for screening mammogram for malignant neoplasm of breast: Secondary | ICD-10-CM | POA: Diagnosis not present

## 2016-02-29 ENCOUNTER — Encounter: Payer: Self-pay | Admitting: Primary Care

## 2016-02-29 NOTE — Addendum Note (Signed)
Addended by: Lianne Cure A on: 02/29/2016 08:58 AM   Modules accepted: Orders

## 2016-03-21 ENCOUNTER — Ambulatory Visit (INDEPENDENT_AMBULATORY_CARE_PROVIDER_SITE_OTHER): Payer: Medicare Other | Admitting: Primary Care

## 2016-03-21 ENCOUNTER — Encounter: Payer: Self-pay | Admitting: Primary Care

## 2016-03-21 VITALS — BP 120/84 | HR 71 | Temp 97.6°F | Ht 67.0 in | Wt 149.0 lb

## 2016-03-21 DIAGNOSIS — I6529 Occlusion and stenosis of unspecified carotid artery: Secondary | ICD-10-CM

## 2016-03-21 DIAGNOSIS — E785 Hyperlipidemia, unspecified: Secondary | ICD-10-CM

## 2016-03-21 DIAGNOSIS — E559 Vitamin D deficiency, unspecified: Secondary | ICD-10-CM | POA: Diagnosis not present

## 2016-03-21 DIAGNOSIS — Z0000 Encounter for general adult medical examination without abnormal findings: Secondary | ICD-10-CM

## 2016-03-21 DIAGNOSIS — I10 Essential (primary) hypertension: Secondary | ICD-10-CM | POA: Diagnosis not present

## 2016-03-21 LAB — COMPREHENSIVE METABOLIC PANEL
ALBUMIN: 4.6 g/dL (ref 3.5–5.2)
ALK PHOS: 56 U/L (ref 39–117)
ALT: 27 U/L (ref 0–35)
AST: 19 U/L (ref 0–37)
BUN: 20 mg/dL (ref 6–23)
CALCIUM: 9.9 mg/dL (ref 8.4–10.5)
CHLORIDE: 103 meq/L (ref 96–112)
CO2: 29 mEq/L (ref 19–32)
CREATININE: 0.8 mg/dL (ref 0.40–1.20)
GFR: 74.28 mL/min (ref >60.00)
Glucose, Bld: 98 mg/dL (ref 70–99)
Potassium: 3.9 mEq/L (ref 3.5–5.1)
Sodium: 139 mEq/L (ref 135–145)
TOTAL PROTEIN: 7.3 g/dL (ref 6.0–8.3)
Total Bilirubin: 0.7 mg/dL (ref 0.2–1.2)

## 2016-03-21 LAB — LIPID PANEL
CHOLESTEROL: 179 mg/dL (ref 0–200)
HDL: 76.7 mg/dL (ref >39.00)
LDL CALC: 90 mg/dL (ref 0–99)
NonHDL: 102.36
TRIGLYCERIDES: 60 mg/dL (ref 0.0–149.0)
Total CHOL/HDL Ratio: 2
VLDL: 12 mg/dL (ref 0.0–40.0)

## 2016-03-21 LAB — VITAMIN D 25 HYDROXY (VIT D DEFICIENCY, FRACTURES): VITD: 43.4 ng/mL (ref 30.00–100.00)

## 2016-03-21 NOTE — Assessment & Plan Note (Signed)
Stable in office today. Continue Losartan and Amlodipine. BMP pending.

## 2016-03-21 NOTE — Assessment & Plan Note (Signed)
Carotid US completed in 2017, stable.

## 2016-03-21 NOTE — Assessment & Plan Note (Signed)
Vitamin D pending, continue supplementation.

## 2016-03-21 NOTE — Assessment & Plan Note (Signed)
All immunizations UTD. Bone density, mammogram, and colonoscopy UTD. Declines pap given age. Discussed importance of regular exercise to maintain good ROM to joints and prevent medical conditions. Exam unremarkable. Labs pending. All recommendations provided upon leaving.   I have personally reviewed and have noted: 1. The patient's medical and social history 2. Their use of alcohol, tobacco or illicit drugs 3. Their current medications and supplements 4. The patient's functional ability including ADL's, fall risks, home safety  risks and hearing or visual impairment. 5. Diet and physical activities 6. Evidence for depression or mood disorder

## 2016-03-21 NOTE — Progress Notes (Signed)
Patient ID: Rachel Bowers, female   DOB: April 11, 1940, 75 y.o.   MRN: [id number]  HPI: Rachel Bowers is a 75 year old female who presents today for her [place].  Past Medical History:  Diagnosis Date  . Arthritis   . 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 Diverticulosis    per colonoscopy  . Heart murmur   . Hyperlipidemia   . Hypertension     Current Outpatient Prescriptions  Medication Sig Dispense Refill  . amLODipine (NORVASC) 5 MG tablet Take 5 mg by mouth daily.    Marland Kitchen aspirin 81 MG tablet Take 81 mg by mouth daily.    Marland Kitchen BIOTIN PO Take by mouth.    . Calcium Carbonate-Vitamin D (CALCIUM-VITAMIN D3 PO) Take 1,000 mg by mouth daily.    Marland Kitchen loratadine (CLARITIN) 10 MG tablet Take 10 mg by mouth daily.    Marland Kitchen losartan (COZAAR) 50 MG tablet Take 50 mg by mouth daily.    Marland Kitchen lovastatin (MEVACOR) 20 MG tablet TAKE 1 TABLET BY MOUTH AT  BEDTIME 90 tablet 1  . Magnesium 500 MG CAPS Take 500 mg by mouth daily.    . Omega-3 Fatty Acids (FISH OIL) 1000 MG CAPS Take 1 capsule by mouth daily.     No current facility-administered medications for this visit.     Allergies  Allergen Reactions  . Penicillins Hives    Family History  Problem Relation Age of Onset  . Heart disease Mother   . Hyperlipidemia Mother   . Hypertension Mother   . Heart attack Mother   . Lung cancer Brother     Social History   Social History  . Marital status: Married    Spouse name: N/A  . Number of children: N/A  . Years of education: N/A   Occupational History  . Not on file.   Social History Main Topics  . Smoking status: Former Smoker    Types: Cigarettes    Quit date: 01/25/1974  . Smokeless tobacco: Never Used  . Alcohol use 0.6 oz/week    1 Glasses of wine per week  . Drug use: No  . Sexual activity: Not on file   Other Topics Concern  . Not on file   Social History Narrative   Married.   1 daughter and 1 son. 2 grandchildren.   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   Enjoys exercising at the [facility], reading.        Hospitiliaztions: None  Health Maintenance:    Flu: Completed in September 2017  Tetanus: Completed in 2014  Pneumovax: Completed in 2009  Prevnar: Completed in 2015  Zostavax: Completed in 2012  Bone Density: Completed in 2016, osteopenia  Colonoscopy: Completed in 2016  Eye Doctor: Scheduled next week.  Dental Exam: Completes annually  Mammogram: Completed in 2017  Pap: Completed numerous years ago. Declines    Providers: Alma Friendly, PCP; Dr. Chuck Hint, Optometrist.   I have personally reviewed and have noted: 1. The patient's medical and social history 2. Their use of alcohol, tobacco or illicit drugs 3. Their current medications and supplements 4. The patient's functional ability including ADL's, fall risks, home safety  risks and hearing or visual impairment. 5. Diet and physical activities 6. Evidence for depression or mood disorder  Subjective:   Review of Systems:   Constitutional: Denies fever, malaise, fatigue, headache or abrupt weight changes.  HEENT: Denies eye pain, eye redness, ear  pain, ringing in the ears, wax buildup, runny nose, nasal congestion, bloody nose, or sore throat. Respiratory: Denies difficulty breathing, shortness of breath, cough or sputum production.   Cardiovascular: Denies chest pain, chest tightness, palpitations or swelling in the hands or feet.  Gastrointestinal: Denies abdominal pain, bloating, constipation, diarrhea or blood in the stool.  GU: Denies urgency, frequency, pain with urination, burning sensation, blood in urine, odor or discharge. Musculoskeletal: Denies decrease in range of motion, difficulty with gait, muscle pain or joint pain and swelling. She has noticed bilateral hip pain that is intermittent. She will experience some lower extremity pain sometimes. The pain will occur in the morning when she wakes, some stiffness.  Skin: Denies redness,  rashes, lesions or ulcercations.  Neurological: Denies dizziness, difficulty with memory, difficulty with speech or problems with balance and coordination.   No other specific complaints in a complete review of systems (except as listed in HPI above).  Objective:  PE:   BP 120/84 (BP Location: Left Arm, Patient Position: Sitting, Cuff Size: Normal)   Pulse 71   Temp 97.6 F (36.4 C) (Oral)   Ht 5\' 7"  (1.702 m)   Wt 149 lb (67.6 kg)   SpO2 98%   BMI 23.34 kg/m  Wt Readings from Last 3 Encounters:  03/21/16 149 lb (67.6 kg)  02/13/16 151 lb (68.5 kg)  09/20/15 152 lb (68.9 kg)    General: Appears their stated age, well developed, well nourished in NAD. Skin: Warm, dry and intact. No rashes, lesions or ulcerations noted. HEENT: Head: normal shape and size; Eyes: sclera white, no icterus, conjunctiva pink, PERRLA and EOMs intact; Ears: Tm's gray and intact, normal light reflex; Nose: mucosa pink and moist, septum midline; Throat/Mouth: Teeth present, mucosa pink and moist, no exudate, lesions or ulcerations noted.  Neck: Normal range of motion. Neck supple, trachea midline. No massses, lumps or thyromegaly present.  Cardiovascular: Normal rate and rhythm. S1,S2 noted.  No murmur, rubs or gallops noted. No JVD or BLE edema. No carotid bruits noted. Pulmonary/Chest: Normal effort and positive vesicular breath sounds. No respiratory distress. No wheezes, rales or ronchi noted.  Abdomen: Soft and nontender. Normal bowel sounds, no bruits noted. No distention or masses noted. Liver, spleen and kidneys non palpable. Musculoskeletal: Normal range of motion. No signs of joint swelling. No difficulty with gait.  Neurological: Alert and oriented. Cranial nerves II-XII intact. Coordination normal. +DTRs bilaterally. Psychiatric: Mood and affect normal. Behavior is normal. Judgment and thought content normal.     BMET No results found for: NA, K, CL, CO2, GLUCOSE, BUN, CREATININE, CALCIUM,  GFRNONAA, GFRAA  Lipid Panel  No results found for: CHOL, TRIG, HDL, CHOLHDL, VLDL, LDLCALC  CBC No results found for: WBC, RBC, HGB, HCT, PLT, MCV, MCH, MCHC, RDW, LYMPHSABS, MONOABS, EOSABS, BASOSABS  Hgb A1C No results found for: HGBA1C    Assessment and Plan:   Medicare Annual Wellness Visit:  Diet: She endorses a fair diet: Breakfast: Bread, eggs, coffee Lunch: Sandwich, hamburgers Dinner: Cereal, snack type food Snacks: Toast with peanut butter, fruit Desserts: Occasionally Beverages: Coffee, milk, water, occasionally soda Physical activity: Active, does exercise.  Depression/mood screen: Negative Hearing: Intact to whispered voice Visual acuity: Grossly normal, performs annual eye exam  ADLs: Capable Fall risk: None Home safety: Good Cognitive evaluation: Intact to orientation, naming, recall and repetition EOL planning: Adv directives completed. Full Code.  Preventative Medicine: All immunizations UTD. Bone density, mammogram, and colonoscopy UTD. Declines pap given age. Discussed importance of  regular exercise to maintain good ROM to joints and prevent medical conditions. Exam unremarkable. Labs pending. All recommendations provided upon leaving.   Next appointment: Follow up in 1 year for [place].

## 2016-03-21 NOTE — Progress Notes (Signed)
Pre visit review using our clinic review tool, if applicable. No additional management support is needed unless otherwise documented below in the visit note. 

## 2016-03-21 NOTE — Assessment & Plan Note (Signed)
Lipids pending. Continue Lovastatin.

## 2016-03-21 NOTE — Patient Instructions (Addendum)
Complete lab work prior to leaving today. I will notify you of your results once received.   Continue your efforts towards a healthy lifestyle. Continue exercising. You should be getting 150 minutes of moderate intensity exercise weekly.  Follow up in 1 year for your annual wellness exam or sooner if needed.  It was a pleasure to see you today!

## 2016-03-27 DIAGNOSIS — H47323 Drusen of optic disc, bilateral: Secondary | ICD-10-CM | POA: Diagnosis not present

## 2016-03-27 DIAGNOSIS — H43813 Vitreous degeneration, bilateral: Secondary | ICD-10-CM | POA: Diagnosis not present

## 2016-03-27 DIAGNOSIS — H2513 Age-related nuclear cataract, bilateral: Secondary | ICD-10-CM | POA: Diagnosis not present

## 2016-03-27 DIAGNOSIS — H353131 Nonexudative age-related macular degeneration, bilateral, early dry stage: Secondary | ICD-10-CM | POA: Diagnosis not present

## 2016-03-27 DIAGNOSIS — H35372 Puckering of macula, left eye: Secondary | ICD-10-CM | POA: Diagnosis not present

## 2016-07-11 ENCOUNTER — Other Ambulatory Visit: Payer: Self-pay | Admitting: Primary Care

## 2016-07-11 DIAGNOSIS — I10 Essential (primary) hypertension: Secondary | ICD-10-CM

## 2016-07-11 MED ORDER — LOSARTAN POTASSIUM 50 MG PO TABS: 50.0000 mg | ORAL_TABLET | Freq: Every day | ORAL | 2 refills | Status: DC

## 2016-07-11 NOTE — Telephone Encounter (Signed)
Received faxed refill request for losartan (COZAAR) 50 MG tablet. Medication have not been prescribed by [name]. Last seen on 03/21/2016

## 2016-10-04 ENCOUNTER — Other Ambulatory Visit: Payer: Self-pay | Admitting: Primary Care

## 2016-12-13 ENCOUNTER — Other Ambulatory Visit: Payer: Self-pay | Admitting: Primary Care

## 2016-12-13 DIAGNOSIS — I10 Essential (primary) hypertension: Secondary | ICD-10-CM

## 2016-12-13 MED ORDER — AMLODIPINE BESYLATE 5 MG PO TABS: 5.0000 mg | ORAL_TABLET | Freq: Every day | ORAL | 0 refills | Status: DC

## 2016-12-13 NOTE — Telephone Encounter (Signed)
Received faxed refill request for amLODipine (NORVASC) 5 MG tablet  Medication have not been prescribed by [name]. Last seen on 03/21/2016

## 2016-12-13 NOTE — Telephone Encounter (Signed)
Patient due for Medicare wellness visit and CPE in December. Please schedule Medicare wellness visit with Leisa and CPE with me. 90 day supply sent to pharmacy.

## 2016-12-14 NOTE — Telephone Encounter (Signed)
Message left for patient to return my call.  

## 2016-12-21 NOTE — Telephone Encounter (Signed)
Message left for patient to return my call.  

## 2016-12-25 NOTE — Telephone Encounter (Addendum)
Send letter for patient to remind her to schedule appt  Send message through [facility]

## 2017-01-01 ENCOUNTER — Other Ambulatory Visit: Payer: Self-pay | Admitting: Primary Care

## 2017-01-01 DIAGNOSIS — I10 Essential (primary) hypertension: Secondary | ICD-10-CM

## 2017-01-11 ENCOUNTER — Ambulatory Visit (INDEPENDENT_AMBULATORY_CARE_PROVIDER_SITE_OTHER): Payer: Medicare Other | Admitting: *Deleted

## 2017-01-11 DIAGNOSIS — Z23 Encounter for immunization: Secondary | ICD-10-CM

## 2017-01-31 ENCOUNTER — Other Ambulatory Visit: Payer: Self-pay | Admitting: Primary Care

## 2017-01-31 DIAGNOSIS — I10 Essential (primary) hypertension: Secondary | ICD-10-CM

## 2017-02-13 ENCOUNTER — Ambulatory Visit (HOSPITAL_COMMUNITY)
Admission: RE | Admit: 2017-02-13 | Discharge: 2017-02-13 | Disposition: A | Discharge status: Home / Self Care | Payer: Medicare Other | Source: Ambulatory Visit | Attending: Family | Admitting: Family

## 2017-02-13 ENCOUNTER — Encounter: Payer: Self-pay | Admitting: Family

## 2017-02-13 ENCOUNTER — Ambulatory Visit (INDEPENDENT_AMBULATORY_CARE_PROVIDER_SITE_OTHER): Payer: Medicare Other | Admitting: Family

## 2017-02-13 VITALS — BP 140/77 | HR 70 | Temp 97.6°F | Resp 18 | Ht 67.0 in | Wt 151.1 lb

## 2017-02-13 DIAGNOSIS — I6523 Occlusion and stenosis of bilateral carotid arteries: Secondary | ICD-10-CM | POA: Diagnosis not present

## 2017-02-13 LAB — VAS US CAROTID
LCCADDIAS: -19 cm/s
LCCAPDIAS: 24 cm/s
LCCAPSYS: 102 cm/s
LEFT ECA DIAS: -22 cm/s
LEFT VERTEBRAL DIAS: 14 cm/s
LICADDIAS: -29 cm/s
LICAPDIAS: -42 cm/s
LICAPSYS: -116 cm/s
Left CCA dist sys: -64 cm/s
Left ICA dist sys: -65 cm/s
RCCAPSYS: 74 cm/s
RIGHT CCA MID DIAS: 29 cm/s
RIGHT ECA DIAS: -34 cm/s
RIGHT VERTEBRAL DIAS: -15 cm/s
Right CCA prox dias: 24 cm/s
Right cca dist sys: -76 cm/s

## 2017-02-13 NOTE — Progress Notes (Signed)
Chief Complaint: Follow up Extracranial Carotid Artery Stenosis   History of Present Illness  Rachel Bowers is a 76 y.o. female who was initially referred to Dr. Trula Slade for evaluation of left carotid stenosis. Several months prior to her initial evaluation the patient felt a lump in her right neck. During her workup she had a CT scan which showed left carotid calcification. This led to a carotid duplex. She was found to have velocity elevations consistent with a 50-69% left carotid stenosis. There was only trace plaque without stenosis in the right carotid artery. She denies any history of TIA or stroke symptoms, specifically she denies a history of amaurosis fugax or monocular blindness, unilateral facial drooping, hemiplegia, or receptive or expressive aphasia.   She does have a history of smoking but quit in the 1970s. There is a family history of premature cardiac death in her mother who died at age 45 of a massive heart attack.  She stopped Fosamax in June 2016 due to right jaw pain and bilateral leg pain, both have mostly resolved since she stopped the Fosamax. 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    Pt Diabetic: no Pt smoker: former smoker, quit in the 1970's, smoked less than a year  Pt meds include: Statin : yes ASA: yes Other anticoagulants/antiplatelets: no   Past Medical History:  Diagnosis Date  . Arthritis   . 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 Diverticulosis    per colonoscopy  . Heart murmur   . Hyperlipidemia   . Hypertension     Social History Social History   Tobacco Use  . Smoking status: Former Smoker    Types: Cigarettes    Last attempt to quit: 01/25/1974    Years since quitting: 43.0  . Smokeless tobacco: Never Used  Substance Use Topics  . Alcohol use: Yes    Alcohol/week: 0.6 oz    Types: 1 Glasses of wine per week  . Drug use: No    Family  History Family History  Problem Relation Age of Onset  . Heart disease Mother   . Hyperlipidemia Mother   . Hypertension Mother   . Heart attack Mother   . Lung cancer Brother     Surgical History Past Surgical History:  Procedure Laterality Date  . ABDOMINAL HYSTERECTOMY  1982  . APPENDECTOMY  2008    Allergies  Allergen Reactions  . Penicillins Hives    Current Outpatient Medications  Medication Sig Dispense Refill  . amLODipine (NORVASC) 5 MG tablet TAKE 1 TABLET BY MOUTH  DAILY 90 tablet 1  . aspirin 81 MG tablet Take 81 mg by mouth daily.    Marland Kitchen BIOTIN PO Take by mouth.    . Calcium Carbonate-Vitamin D (CALCIUM-VITAMIN D3 PO) Take 1,000 mg by mouth daily.    . Cholecalciferol (VITAMIN D3) 10000 units TABS Take by mouth.    . loratadine (CLARITIN) 10 MG tablet Take 10 mg by mouth daily.    Marland Kitchen losartan (COZAAR) 50 MG tablet TAKE 1 TABLET BY MOUTH  DAILY 90 tablet 0  . lovastatin (MEVACOR) 20 MG tablet TAKE 1 TABLET BY MOUTH AT  BEDTIME 90 tablet 1  . Magnesium 500 MG CAPS Take 500 mg by mouth daily.    . Omega-3 Fatty Acids (FISH OIL) 1000 MG CAPS Take 1 capsule by mouth daily.     No current facility-administered medications for  this visit.     Review of Systems : See HPI for pertinent positives and negatives.  Physical Examination  Vitals:   02/13/17 1149 02/13/17 1150  BP: 131/80 140/77  Pulse: 70   Resp: 18   Temp: 97.6 F (36.4 C)   TempSrc: Oral   SpO2: 99%   Weight: 151 lb 1.6 oz (68.5 kg)   Height: 5\' 7"  (1.702 m)    Body mass index is 23.67 kg/m.  General: WDWN female in NAD GAIT:normal Eyes: PERRLA Pulmonary: Respirations are non-labored, CTAB, good air movement in all fields, no rales, rhonchi, or wheezing.  Cardiac: regular rhythm, no detected murmur.  VASCULAR EXAM Carotid Bruits Right Left   Negative Negative   Abdominal aortic pulse is not palpable. Radial pulses are 2+ palpable and equal.   LE  Pulses Right Left  POPLITEAL not palpable not palpable  POSTERIOR TIBIAL faintly palpable faintly palpable  DORSALIS PEDIS ANTERIOR TIBIAL 1+ palpable 1+ palpable    Gastrointestinal: soft, nontender, BS WNL, no r/g, no palpable masses.  Musculoskeletal: no muscle atrophy/wasting. M/S 5/5 throughout, extremities without ischemic changes.  Neurologic: A&O X 3; appropriate affect, speech is normal, CN 2-12 intact, pain and light touch intact in extremities, Motor exam as listed above.    Assessment: Rachel Bowers is a 76 y.o. female who has no history of stroke or TIA. Fortunately she does not have DM, smoked for less than a year in the 9's, has a normal BMI, and stays physically active.   DATA Carotid Duplex (02/13/17): Right ICA: 1-39% stenosis. Left ICA: 40-59% stenosis. Bilateral vertebral artery flow is antegrade. Bilateral subclavian artery waveforms are multiphasic. Increased stenosis of the left ICA compared to the exam on 02-13-16.     Plan: Follow-up in 1 year with Carotid Duplex scan.   I discussed in depth with the patient the nature of atherosclerosis, and emphasized the importance of maximal medical management including strict control of blood pressure, blood glucose, and lipid levels, obtaining regular exercise, and continued cessation of smoking.  The patient is aware that without maximal medical management the underlying atherosclerotic disease process will progress, limiting the benefit of any interventions. The patient was given information about stroke prevention and what symptoms should prompt the patient to seek immediate medical care. Thank you for allowing [place] to participate in this patient's care.  Clemon Chambers, RN, MSN, FNP-C [facility] Office: [place]: [place]  02/13/17 11:59 AM

## 2017-02-13 NOTE — Patient Instructions (Signed)

## 2017-02-15 ENCOUNTER — Encounter: Payer: Self-pay | Admitting: Primary Care

## 2017-03-04 DIAGNOSIS — Z1231 Encounter for screening mammogram for malignant neoplasm of breast: Secondary | ICD-10-CM | POA: Diagnosis not present

## 2017-03-04 LAB — HM MAMMOGRAPHY

## 2017-03-07 ENCOUNTER — Encounter: Payer: Self-pay | Admitting: Primary Care

## 2017-03-07 NOTE — Addendum Note (Signed)
Addended by: Lianne Cure A on: 03/07/2017 10:04 AM   Modules accepted: Orders

## 2017-03-26 ENCOUNTER — Other Ambulatory Visit: Payer: Self-pay | Admitting: Primary Care

## 2017-03-26 DIAGNOSIS — I10 Essential (primary) hypertension: Secondary | ICD-10-CM

## 2017-03-26 MED ORDER — LOSARTAN POTASSIUM 50 MG PO TABS: 50.0000 mg | ORAL_TABLET | Freq: Every day | ORAL | 0 refills | Status: DC

## 2017-03-27 ENCOUNTER — Ambulatory Visit (INDEPENDENT_AMBULATORY_CARE_PROVIDER_SITE_OTHER): Payer: Medicare Other

## 2017-03-27 ENCOUNTER — Other Ambulatory Visit: Payer: Self-pay | Admitting: Primary Care

## 2017-03-27 VITALS — BP 124/78 | HR 68 | Temp 97.7°F | Ht 66.25 in | Wt 151.2 lb

## 2017-03-27 DIAGNOSIS — E785 Hyperlipidemia, unspecified: Secondary | ICD-10-CM

## 2017-03-27 DIAGNOSIS — E559 Vitamin D deficiency, unspecified: Secondary | ICD-10-CM | POA: Diagnosis not present

## 2017-03-27 DIAGNOSIS — I10 Essential (primary) hypertension: Secondary | ICD-10-CM

## 2017-03-27 DIAGNOSIS — Z0000 Encounter for general adult medical examination without abnormal findings: Secondary | ICD-10-CM

## 2017-03-27 LAB — LIPID PANEL
CHOLESTEROL: 165 mg/dL (ref 0–200)
HDL: 71 mg/dL (ref >39.00)
LDL Cholesterol: 76 mg/dL (ref 0–99)
NonHDL: 93.7
TRIGLYCERIDES: 89 mg/dL (ref 0.0–149.0)
Total CHOL/HDL Ratio: 2
VLDL: 17.8 mg/dL (ref 0.0–40.0)

## 2017-03-27 LAB — COMPREHENSIVE METABOLIC PANEL
ALBUMIN: 4.3 g/dL (ref 3.5–5.2)
ALK PHOS: 58 U/L (ref 39–117)
ALT: 23 U/L (ref 0–35)
AST: 16 U/L (ref 0–37)
BILIRUBIN TOTAL: 0.6 mg/dL (ref 0.2–1.2)
BUN: 20 mg/dL (ref 6–23)
CALCIUM: 9.8 mg/dL (ref 8.4–10.5)
CO2: 30 mEq/L (ref 19–32)
CREATININE: 0.84 mg/dL (ref 0.40–1.20)
Chloride: 103 mEq/L (ref 96–112)
GFR: 70.02 mL/min (ref >60.00)
Glucose, Bld: 97 mg/dL (ref 70–99)
Potassium: 4.2 mEq/L (ref 3.5–5.1)
SODIUM: 139 meq/L (ref 135–145)
TOTAL PROTEIN: 7.4 g/dL (ref 6.0–8.3)

## 2017-03-27 LAB — VITAMIN D 25 HYDROXY (VIT D DEFICIENCY, FRACTURES): VITD: 52.34 ng/mL (ref 30.00–100.00)

## 2017-03-27 NOTE — Progress Notes (Signed)
Subjective:   Rachel Bowers is a 76 y.o. female who presents for Medicare Annual (Subsequent) preventive examination.  Review of Systems:  N/A Cardiac Risk Factors include: advanced age (>92men, >53 women);dyslipidemia;hypertension     Objective:     Vitals: BP 124/78 (BP Location: Right Arm, Patient Position: Sitting, Cuff Size: Normal)   Pulse 68   Temp 97.7 F (36.5 C) (Oral)   Ht 5' 6.25" (1.683 m) Comment: no shoes  Wt 151 lb 4 oz (68.6 kg)   SpO2 95%   BMI 24.23 kg/m   Body mass index is 24.23 kg/m.  Advanced Directives 03/27/2017 01/31/2015 01/25/2014  Does Patient Have a Medical Advance Directive? Yes Yes Yes  Type of Paramedic of [place];Living will [place];Living will [place];Living will  Does patient want to make changes to medical advance directive? - No - Patient declined -  Copy of [place] in Chart? No - copy requested No - copy requested No - copy requested    Tobacco Social History   Tobacco Use  Smoking Status Former Smoker  . Types: Cigarettes  . Last attempt to quit: 01/25/1974  . Years since quitting: 43.1  Smokeless Tobacco Never Used     Counseling given: No   Clinical Intake:  Pre-visit preparation completed: Yes  Pain : No/denies pain Pain Score: 0-No pain     Nutritional Status: BMI of 19-24  Normal Nutritional Risks: None Diabetes: No  How often do you need to have someone help you when you read instructions, pamphlets, or other written materials from your doctor or pharmacy?: 1 - Never What is the last grade level you completed in school?: 12th grade  Interpreter Needed?: No  Comments: pt lives with spouse Information entered by :: LPinson, LPN  Past Medical History:  Diagnosis Date  . Arthritis   . 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 Diverticulosis    per colonoscopy  . Heart murmur   . Hyperlipidemia   . Hypertension     Past Surgical History:  Procedure Laterality Date  . ABDOMINAL HYSTERECTOMY  1982  . APPENDECTOMY  2008   Family History  Problem Relation Age of Onset  . Heart disease Mother   . Hyperlipidemia Mother   . Hypertension Mother   . Heart attack Mother   . Lung cancer Brother    Social History   Socioeconomic History  . Marital status: Married    Spouse name: None  . Number of children: None  . Years of education: None  . Highest education level: None  Social Needs  . Financial resource strain: None  . Food insecurity - worry: None  . Food insecurity - inability: None  . Transportation needs - medical: None  . Transportation needs - non-medical: None  Occupational History  . Occupation: retired  Tobacco Use  . Smoking status: Former Smoker    Types: Cigarettes    Last attempt to quit: 01/25/1974    Years since quitting: 43.1  . Smokeless tobacco: Never Used  Substance and Sexual Activity  . Alcohol use: Yes    Alcohol/week: 0.6 oz    Types: 1 Glasses of wine per week  . Drug use: No  . Sexual activity: None  Other Topics Concern  . None  Social History Narrative   Married.   1 daughter and 1 son. 2 grandchildren.   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   Enjoys exercising at the [facility], reading.     Outpatient Encounter Medications as of 03/27/2017  Medication Sig  . amLODipine (NORVASC) 5 MG tablet TAKE 1 TABLET BY MOUTH  DAILY  . aspirin 81 MG tablet Take 81 mg by mouth daily.  Marland Kitchen BIOTIN PO Take by mouth.  . Calcium Carbonate-Vitamin D (CALCIUM-VITAMIN D3 PO) Take 1,000 mg by mouth daily.  . Cholecalciferol (VITAMIN D3) 10000 units TABS Take by mouth.  . loratadine (CLARITIN) 10 MG tablet Take 10 mg by mouth daily.  Marland Kitchen losartan (COZAAR) 50 MG tablet Take 1 tablet (50 mg total) by mouth daily.  Marland Kitchen lovastatin (MEVACOR) 20 MG tablet TAKE 1 TABLET BY MOUTH AT  BEDTIME  . Magnesium 500 MG CAPS Take 500 mg by mouth  daily.  . Omega-3 Fatty Acids (FISH OIL) 1000 MG CAPS Take 1 capsule by mouth daily.   No facility-administered encounter medications on file as of 03/27/2017.     Activities of Daily Living In your present state of health, do you have any difficulty performing the following activities: 03/27/2017  Hearing? N  Vision? N  Difficulty concentrating or making decisions? N  Walking or climbing stairs? N  Dressing or bathing? N  Doing errands, shopping? N  Preparing Food and eating ? N  Using the Toilet? N  In the past six months, have you accidently leaked urine? N  Do you have problems with loss of bowel control? N  Managing your Medications? N  Managing your Finances? N  Housekeeping or managing your Housekeeping? N  Some recent data might be hidden    Patient Care Team: Pleas Koch, NP as PCP - General (Internal Medicine)    Assessment:   This is a routine wellness examination for Rachel Bowers.  Hearing Screening   125Hz  250Hz  500Hz  1000Hz  2000Hz  3000Hz  4000Hz  6000Hz  8000Hz   Right ear:   40 40 40  40    Left ear:   40 40 40  40    Vision Screening Comments: Dec 2017 with Dr. Ellin Mayhew; needs to find new eye professional due to insurance    Exercise Activities and Dietary recommendations Current Exercise Habits: Home exercise routine, Type of exercise: walking, Time (Minutes): 20, Frequency (Times/Week): 5, Weekly Exercise (Minutes/Week): 100, Intensity: Mild, Exercise limited by: None identified  Goals    . Increase physical activity     Starting 03/27/2017, I will continue to walk for 20 minutes 5-6 days per week.        Fall Risk Fall Risk  03/27/2017 03/21/2016  Falls in the past year? No No   Depression Screen Depression screen [facility] 2/9 03/27/2017 03/21/2016  Decreased Interest 1 0  Down, Depressed, Hopeless 1 1  PHQ - 2 Score 2 1  Altered sleeping 2 -  Tired, decreased energy 0 -  Change in appetite 0 -  Feeling bad or failure about yourself  1 -  Trouble  concentrating 0 -  Moving slowly or fidgety/restless 0 -  Suicidal thoughts 0 -  PHQ-9 Score 5 -  Difficult doing work/chores Not difficult at all -     Cognitive Function MMSE - Mini Mental State Exam 03/27/2017  Orientation to time 5  Orientation to Place 5  Registration 3  Attention/ Calculation 0  Recall 3  Language- name 2 objects 0  Language- repeat 1  Language- follow 3 step command 3  Language- read & follow direction 0  Write a sentence 0  Copy  design 0  Total score 20     PLEASE NOTE: A Mini-Cog screen was completed. Maximum score is 20. A value of 0 denotes this part of [place] MMSE was not completed or the patient failed this part of the Mini-Cog screening.   Mini-Cog Screening Orientation to Time - Max 5 pts Orientation to Place - Max 5 pts Registration - Max 3 pts Recall - Max 3 pts Language Repeat - Max 1 pts Language Follow 3 Step Command - Max 3 pts     Immunization History  Administered Date(s) Administered  . Influenza,inj,Quad PF,6+ Mos 12/30/2015, 01/11/2017  . Influenza-Unspecified 12/24/2014  . Pneumococcal Conjugate-13 02/22/2014  . Pneumococcal Polysaccharide-23 01/14/2006, 02/06/2008  . Tdap 02/17/2013  . Zoster 02/12/2011      Screening Tests Health Maintenance  Topic Date Due  . TETANUS/TDAP  02/18/2023  . INFLUENZA VACCINE  Completed  . DEXA SCAN  Completed  . PNA vac Low Risk Adult  Completed      Plan:     I have personally reviewed, addressed, and noted the following in the patient's chart:  A. Medical and social history B. Use of alcohol, tobacco or illicit drugs  C. Current medications and supplements D. Functional ability and status E.  Nutritional status F.  Physical activity G. Advance directives H. List of other physicians I.  Hospitalizations, surgeries, and ER visits in previous 12 months J.  March ARB to include hearing, vision, cognitive, depression L. Referrals and appointments - none  In  addition, I have reviewed and discussed with patient certain preventive protocols, quality metrics, and best practice recommendations. A written personalized care plan for preventive services as well as general preventive health recommendations were provided to patient.  See attached scanned questionnaire for additional information.   Signed,   Lindell Noe, MHA, BS, LPN Health Coach

## 2017-03-27 NOTE — Progress Notes (Signed)
PCP notes:   Health maintenance:  No gaps identified.  Abnormal screenings:   Depression score: 5  Patient concerns:   None  Nurse concerns:  None  Next PCP appt:   03/28/17 @ 1015

## 2017-03-27 NOTE — Progress Notes (Signed)
Pre visit review using our clinic review tool, if applicable. No additional management support is needed unless otherwise documented below in the visit note. 

## 2017-03-27 NOTE — Patient Instructions (Addendum)
Ms. Commisso , Thank you for taking time to come for your Medicare Wellness Visit. I appreciate your ongoing commitment to your health goals. Please review the following plan we discussed and let me know if I can assist you in the future.   These are the goals we discussed: Goals    . Increase physical activity     Starting 03/27/2017, I will continue to walk for 20 minutes 5-6 days per week.        This is a list of the screening recommended for you and due dates:  Health Maintenance  Topic Date Due  . Tetanus Vaccine  02/18/2023  . Flu Shot  Completed  . DEXA scan (bone density measurement)  Completed  . Pneumonia vaccines  Completed   Preventive Care for Adults  A healthy lifestyle and preventive care can promote health and wellness. Preventive health guidelines for adults include the following key practices.  . A routine yearly physical is a good way to check with your health care provider about your health and preventive screening. It is a chance to share any concerns and updates on your health and to receive a thorough exam.  . Visit your dentist for a routine exam and preventive care every 6 months. Brush your teeth twice a day and floss once a day. Good oral hygiene prevents tooth decay and gum disease.  . The frequency of eye exams is based on your age, health, family medical history, use  of contact lenses, and other factors. Follow your health care provider's recommendations for frequency of eye exams.  . Eat a healthy diet. Foods like vegetables, fruits, whole grains, low-fat dairy products, and lean protein foods contain the nutrients you need without too many calories. Decrease your intake of foods high in solid fats, added sugars, and salt. Eat the right amount of calories for you. Get information about a proper diet from your health care provider, if necessary.  . Regular physical exercise is one of the most important things you can do for your health. Most adults should  get at least 150 minutes of moderate-intensity exercise (any activity that increases your heart rate and causes you to sweat) each week. In addition, most adults need muscle-strengthening exercises on 2 or more days a week.  Silver Sneakers may be a benefit available to you. To determine eligibility, you may visit the website: www.silversneakers.com or contact program at 914-736-5211 Mon-Fri between 8AM-8PM.   . Maintain a healthy weight. The body mass index (BMI) is a screening tool to identify possible weight problems. It provides an estimate of body fat based on height and weight. Your health care provider can find your BMI and can help you achieve or maintain a healthy weight.   For adults 20 years and older: ? A BMI below 18.5 is considered underweight. ? A BMI of 18.5 to 24.9 is normal. ? A BMI of 25 to 29.9 is considered overweight. ? A BMI of 30 and above is considered obese.   . Maintain normal blood lipids and cholesterol levels by exercising and minimizing your intake of saturated fat. Eat a balanced diet with plenty of fruit and vegetables. Blood tests for lipids and cholesterol should begin at age 67 and be repeated every 5 years. If your lipid or cholesterol levels are high, you are over 50, or you are at high risk for heart disease, you may need your cholesterol levels checked more frequently. Ongoing high lipid and cholesterol levels should be treated with  medicines if diet and exercise are not working.  . If you smoke, find out from your health care provider how to quit. If you do not use tobacco, please do not start.  . If you choose to drink alcohol, please do not consume more than 2 drinks per day. One drink is considered to be 12 ounces (355 mL) of beer, 5 ounces (148 mL) of wine, or 1.5 ounces (44 mL) of liquor.  . If you are 62-32 years old, ask your health care provider if you should take aspirin to prevent strokes.  . Use sunscreen. Apply sunscreen liberally and  repeatedly throughout the day. You should seek shade when your shadow is shorter than you. Protect yourself by wearing long sleeves, pants, a wide-brimmed hat, and sunglasses year round, whenever you are outdoors.  . Once a month, do a whole body skin exam, using a mirror to look at the skin on your back. Tell your health care provider of new moles, moles that have irregular borders, moles that are larger than a pencil eraser, or moles that have changed in shape or color.

## 2017-03-27 NOTE — Progress Notes (Signed)
I reviewed health advisor's note, was available for consultation, and agree with documentation and plan.  

## 2017-03-28 ENCOUNTER — Ambulatory Visit (INDEPENDENT_AMBULATORY_CARE_PROVIDER_SITE_OTHER): Payer: Medicare Other | Admitting: Primary Care

## 2017-03-28 ENCOUNTER — Encounter: Payer: Self-pay | Admitting: Primary Care

## 2017-03-28 ENCOUNTER — Encounter: Payer: Self-pay | Admitting: *Deleted

## 2017-03-28 VITALS — BP 124/78 | HR 68 | Temp 97.7°F | Ht 66.25 in | Wt 151.4 lb

## 2017-03-28 DIAGNOSIS — Z0000 Encounter for general adult medical examination without abnormal findings: Secondary | ICD-10-CM | POA: Diagnosis not present

## 2017-03-28 DIAGNOSIS — M8580 Other specified disorders of bone density and structure, unspecified site: Secondary | ICD-10-CM

## 2017-03-28 DIAGNOSIS — E785 Hyperlipidemia, unspecified: Secondary | ICD-10-CM

## 2017-03-28 DIAGNOSIS — N898 Other specified noninflammatory disorders of vagina: Secondary | ICD-10-CM | POA: Insufficient documentation

## 2017-03-28 DIAGNOSIS — M150 Primary generalized (osteo)arthritis: Secondary | ICD-10-CM | POA: Diagnosis not present

## 2017-03-28 DIAGNOSIS — E2839 Other primary ovarian failure: Secondary | ICD-10-CM

## 2017-03-28 DIAGNOSIS — M159 Polyosteoarthritis, unspecified: Secondary | ICD-10-CM

## 2017-03-28 DIAGNOSIS — I10 Essential (primary) hypertension: Secondary | ICD-10-CM

## 2017-03-28 DIAGNOSIS — M1990 Unspecified osteoarthritis, unspecified site: Secondary | ICD-10-CM | POA: Insufficient documentation

## 2017-03-28 DIAGNOSIS — E559 Vitamin D deficiency, unspecified: Secondary | ICD-10-CM

## 2017-03-28 NOTE — Assessment & Plan Note (Signed)
Located to hands, hips, knees, ankles. Occasionally taking Tylenol and Advil with relief. Mostly using essential oils.

## 2017-03-28 NOTE — Progress Notes (Signed)
Subjective:    Patient ID: Rachel Bowers, female    DOB: [birth year], 76 y.o.   MRN: [id number]  HPI  Rachel Bowers is a 76 year old female who presents today for complete physical.  Experiencing pain to hands, knees, ankles, hips.  Using essential oils with improvement, and frequent ibuprofen use.  Decreased sexual drive also vaginal dryness.  Was once trialed on estrogen cream without improvement.  She does not use any lubricants or moisturizers at this time.  Immunizations: -Tetanus: Completed in 2014 -Influenza: Completed this season -Pneumonia: Completed Prevnar in 2015, Pneumovax in 2009 -Shingles: Completed in 2012  Diet: She endorses a healthy diet. Breakfast: Breakfast cookies, eggs/bacon Lunch: Peanut butter sandwich Dinner: Meat, vegetables, pasta  Snacks: Yogurt, nuts Desserts: Occasionally  Beverages: Water, milk, orange juice, occasional sweet tea  Exercise: She is walking when the weather is nice (20 minutes) Eye exam: Completed in 2017, scheduled for this month but was notified that they no longer take her insurance.  Dental exam: Completes annually Colonoscopy: Completed in 2016 Dexa: Completed in 2016, osteopenia. Due. Mammogram: Completed in 2018    Review of Systems  Constitutional: Negative for unexpected weight change.  HENT: Negative for rhinorrhea.   Respiratory: Negative for cough and shortness of breath.   Cardiovascular: Negative for chest pain.  Gastrointestinal: Negative for constipation and diarrhea.  Genitourinary: Negative for difficulty urinating.       Vaginal dryness  Musculoskeletal: Positive for arthralgias. Negative for myalgias.  Skin: Negative for rash.  Allergic/Immunologic: Negative for environmental allergies.  Neurological: Negative for dizziness, numbness and headaches.  Psychiatric/Behavioral:       Intermittent depression since taking care of her husband.        Past Medical History:  Diagnosis Date  . Arthritis   .  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 Diverticulosis    per colonoscopy  . Heart murmur   . Hyperlipidemia   . Hypertension      Social History   Socioeconomic History  . Marital status: Married    Spouse name: Not on file  . Number of children: Not on file  . Years of education: Not on file  . Highest education level: Not on file  Social Needs  . Financial resource strain: Not on file  . Food insecurity - worry: Not on file  . Food insecurity - inability: Not on file  . Transportation needs - medical: Not on file  . Transportation needs - non-medical: Not on file  Occupational History  . Occupation: retired  Tobacco Use  . Smoking status: Former Smoker    Types: Cigarettes    Last attempt to quit: 01/25/1974    Years since quitting: 43.2  . Smokeless tobacco: Never Used  Substance and Sexual Activity  . Alcohol use: Yes    Alcohol/week: 0.6 oz    Types: 1 Glasses of wine per week  . Drug use: No  . Sexual activity: Not on file  Other Topics Concern  . Not on file  Social History Narrative   Married.   1 daughter and 1 son. 2 grandchildren.   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   Enjoys exercising at the [facility], reading.     Past Surgical History:  Procedure Laterality Date  . ABDOMINAL HYSTERECTOMY  1982  . APPENDECTOMY  2008    Family History  Problem Relation Age of Onset  . Heart disease Mother   .  Hyperlipidemia Mother   . Hypertension Mother   . Heart attack Mother   . Lung cancer Brother     Allergies  Allergen Reactions  . Penicillins Hives    Current Outpatient Medications on File Prior to Visit  Medication Sig Dispense Refill  . amLODipine (NORVASC) 5 MG tablet TAKE 1 TABLET BY MOUTH  DAILY 90 tablet 1  . aspirin 81 MG tablet Take 81 mg by mouth daily.    Marland Kitchen BIOTIN PO Take by mouth.    . Calcium Carbonate-Vitamin D (CALCIUM-VITAMIN D3 PO) Take 1,000 mg by mouth daily.    . Cholecalciferol  (VITAMIN D3) 10000 units TABS Take by mouth.    . loratadine (CLARITIN) 10 MG tablet Take 10 mg by mouth daily.    Marland Kitchen losartan (COZAAR) 50 MG tablet Take 1 tablet (50 mg total) by mouth daily. 90 tablet 0  . lovastatin (MEVACOR) 20 MG tablet TAKE 1 TABLET BY MOUTH AT  BEDTIME 90 tablet 0  . Magnesium 500 MG CAPS Take 500 mg by mouth daily.    . Omega-3 Fatty Acids (FISH OIL) 1000 MG CAPS Take 1 capsule by mouth daily.     No current facility-administered medications on file prior to visit.     BP 124/78   Pulse 68   Temp 97.7 F (36.5 C) (Oral)   Ht 5' 6.25" (1.683 m)   Wt 151 lb 6.4 oz (68.7 kg)   SpO2 95%   BMI 24.25 kg/m    Objective:   Physical Exam  Constitutional: She is oriented to person, place, and time. She appears well-nourished.  HENT:  Right Ear: Tympanic membrane and ear canal normal.  Left Ear: Tympanic membrane and ear canal normal.  Nose: Nose normal.  Mouth/Throat: Oropharynx is clear and moist.  Eyes: Conjunctivae and EOM are normal. Pupils are equal, round, and reactive to light.  Neck: Neck supple. No thyromegaly present.  Cardiovascular: Normal rate and regular rhythm.  No murmur heard. Pulmonary/Chest: Effort normal and breath sounds normal. She has no rales.  Abdominal: Soft. Bowel sounds are normal. There is no tenderness.  Musculoskeletal: Normal range of motion.  Lymphadenopathy:    She has no cervical adenopathy.  Neurological: She is alert and oriented to person, place, and time. She has normal reflexes. No cranial nerve deficit.  Skin: Skin is warm and dry. No rash noted.  Psychiatric: She has a normal mood and affect.          Assessment & Plan:

## 2017-03-28 NOTE — Assessment & Plan Note (Signed)
Immunizations up-to-date. Mammogram up-to-date. Colonoscopy up-to-date. Recommended to increase vegetables, fresh fruit, whole grains.  Recommended regular exercise not only for heart health but also for arthritis treatment. Exam unremarkable. Labs unremarkable. Follow-up in 1 year.

## 2017-03-28 NOTE — Assessment & Plan Note (Signed)
Recent lipid panel unremarkable.  Continue lovastatin.

## 2017-03-28 NOTE — Assessment & Plan Note (Signed)
Stable in the office today, continue amlodipine 5 mg and losartan 50 mg. BMP unremarkable.

## 2017-03-28 NOTE — Patient Instructions (Addendum)
[facility] for contacts.  Vaginal Moisturizer, not lubricant. Any brand. This can be purchased over the counter.   Start stretching daily, start working out at [employer] more frequently.  Ensure you are consuming 64 ounces of water daily.  Increase vegetables, fruit, whole grains, water.  Call [facility] to schedule your bone density scan.  Follow up in 1 year for your annual exam or sooner if needed.  It was a pleasure to see you today!   Kegel Exercises Kegel exercises help strengthen the muscles that support the rectum, vagina, small intestine, bladder, and uterus. Doing Kegel exercises can help:  Improve bladder and bowel control.  Improve sexual response.  Reduce problems and discomfort during pregnancy.  Kegel exercises involve squeezing your pelvic floor muscles, which are the same muscles you squeeze when you try to stop the flow of urine. The exercises can be done while sitting, standing, or lying down, but it is best to vary your position. Phase 1 exercises 1. Squeeze your pelvic floor muscles tight. You should feel a tight lift in your rectal area. If you are a female, you should also feel a tightness in your vaginal area. Keep your stomach, buttocks, and legs relaxed. 2. Hold the muscles tight for up to 10 seconds. 3. Relax your muscles. Repeat this exercise 50 times a day or as many times as told by your health care provider. Continue to do this exercise for at least 4-6 weeks or for as long as told by your health care provider. This information is not intended to replace advice given to you by your health care provider. Make sure you discuss any questions you have with your health care provider. Document Released: 03/12/2012 Document Revised: 11/19/2015 Document Reviewed: 02/13/2015 Elsevier Interactive Patient Education  2018 [employer].    Osteoarthritis Osteoarthritis is a type of arthritis that affects tissue that covers the ends of bones in joints  (cartilage). Cartilage acts as a cushion between the bones and helps them move smoothly. Osteoarthritis results when cartilage in the joints gets worn down. Osteoarthritis is sometimes called "wear and tear" arthritis. Osteoarthritis is the most common form of arthritis. It often occurs in older people. It is a condition that gets worse over time (a progressive condition). Joints that are most often affected by this condition are in:  Fingers.  Toes.  Hips.  Knees.  Spine, including neck and lower back.  What are the causes? This condition is caused by age-related wearing down of cartilage that covers the ends of bones. What increases the risk? The following factors may make you more likely to develop this condition:  Older age.  Being overweight or obese.  Overuse of joints, such as in athletes.  Past injury of a joint.  Past surgery on a joint.  Family history of osteoarthritis.  What are the signs or symptoms? The main symptoms of this condition are pain, swelling, and stiffness in the joint. The joint may lose its shape over time. Small pieces of bone or cartilage may break off and float inside of the joint, which may cause more pain and damage to the joint. Small deposits of bone (osteophytes) may grow on the edges of the joint. Other symptoms may include:  A grating or scraping feeling inside the joint when you move it.  Popping or creaking sounds when you move.  Symptoms may affect one or more joints. Osteoarthritis in a major joint, such as your knee or hip, can make it painful to  walk or exercise. If you have osteoarthritis in your hands, you might not be able to grip items, twist your hand, or control small movements of your hands and fingers (fine motor skills). How is this diagnosed? This condition may be diagnosed based on:  Your medical history.  A physical exam.  Your symptoms.  X-rays of the affected joint(s).  Blood tests to rule out other types of  arthritis.  How is this treated? There is no cure for this condition, but treatment can help to control pain and improve joint function. Treatment plans may include:  A prescribed exercise program that allows for rest and joint relief. You may work with a physical therapist.  A weight control plan.  Pain relief techniques, such as: ? Applying heat and cold to the joint. ? Electric pulses delivered to nerve endings under the skin (transcutaneous electrical nerve stimulation, or TENS). ? Massage. ? Certain nutritional supplements.  NSAIDs or prescription medicines to help relieve pain.  Medicine to help relieve pain and inflammation (corticosteroids). This can be given by mouth (orally) or as an injection.  Assistive devices, such as a brace, wrap, splint, specialized glove, or cane.  Surgery, such as: ? An osteotomy. This is done to reposition the bones and relieve pain or to remove loose pieces of bone and cartilage. ? Joint replacement surgery. You may need this surgery if you have very bad (advanced) osteoarthritis.  Follow these instructions at home: Activity  Rest your affected joints as directed by your health care provider.  Do not drive or use heavy machinery while taking prescription pain medicine.  Exercise as directed. Your health care provider or physical therapist may recommend specific types of exercise, such as: ? Strengthening exercises. These are done to strengthen the muscles that support joints that are affected by arthritis. They can be performed with weights or with exercise bands to add resistance. ? Aerobic activities. These are exercises, such as brisk walking or water aerobics, that get your heart pumping. ? Range-of-motion activities. These keep your joints easy to move. ? Balance and agility exercises. Managing pain, stiffness, and swelling  If directed, apply heat to the affected area as often as told by your health care provider. Use the heat source  that your health care provider recommends, such as a moist heat pack or a heating pad. ? If you have a removable assistive device, remove it as told by your health care provider. ? Place a towel between your skin and the heat source. If your health care provider tells you to keep the assistive device on while you apply heat, place a towel between the assistive device and the heat source. ? Leave the heat on for 20-30 minutes. ? Remove the heat if your skin turns bright red. This is especially important if you are unable to feel pain, heat, or cold. You may have a greater risk of getting burned.  If directed, put ice on the affected joint: ? If you have a removable assistive device, remove it as told by your health care provider. ? Put ice in a plastic bag. ? Place a towel between your skin and the bag. If your health care provider tells you to keep the assistive device on during icing, place a towel between the assistive device and the bag. ? Leave the ice on for 20 minutes, 2-3 times a day. General instructions  Take over-the-counter and prescription medicines only as told by your health care provider.  Maintain a healthy weight.  Follow instructions from your health care provider for weight control. These may include dietary restrictions.  Do not use any products that contain nicotine or tobacco, such as cigarettes and e-cigarettes. These can delay bone healing. If you need help quitting, ask your health care provider.  Use assistive devices as directed by your health care provider.  Keep all follow-up visits as told by your health care provider. This is important. Where to find more information:  [employer] of Arthritis and Musculoskeletal and Skin Diseases: www.niams.SouthExposed.es  [employer] on Aging: http://kim-miller.com/  American College of Rheumatology: www.rheumatology.org Contact a health care provider if:  Your skin turns red.  You develop a rash.  You have pain that  gets worse.  You have a fever along with joint or muscle aches. Get help right away if:  You lose a lot of weight.  You suddenly lose your appetite.  You have night sweats. Summary  Osteoarthritis is a type of arthritis that affects tissue covering the ends of bones in joints (cartilage).  This condition is caused by age-related wearing down of cartilage that covers the ends of bones.  The main symptom of this condition is pain, swelling, and stiffness in the joint.  There is no cure for this condition, but treatment can help to control pain and improve joint function. This information is not intended to replace advice given to you by your health care provider. Make sure you discuss any questions you have with your health care provider. Document Released: 03/26/2005 Document Revised: 11/28/2015 Document Reviewed: 11/28/2015 Elsevier Interactive Patient Education  [employer].

## 2017-03-28 NOTE — Assessment & Plan Note (Signed)
Recent vitamin D level stable.  Continue calcium with vitamin D and supplemental vitamin D at 1000 units once daily.  Repeat bone density scan pending.

## 2017-03-28 NOTE — Assessment & Plan Note (Signed)
Also with low sex drive.  Discussed different options for treatment, she will start with over-the-counter moisturizers.  She is failed treatment on vaginal creams in the past and does not wish to pursue this again.

## 2017-04-04 ENCOUNTER — Encounter: Payer: Self-pay | Admitting: Primary Care

## 2017-04-04 DIAGNOSIS — H35372 Puckering of macula, left eye: Secondary | ICD-10-CM | POA: Diagnosis not present

## 2017-04-04 DIAGNOSIS — H01021 Squamous blepharitis right upper eyelid: Secondary | ICD-10-CM | POA: Diagnosis not present

## 2017-04-04 DIAGNOSIS — H2513 Age-related nuclear cataract, bilateral: Secondary | ICD-10-CM | POA: Diagnosis not present

## 2017-04-04 DIAGNOSIS — H04123 Dry eye syndrome of bilateral lacrimal glands: Secondary | ICD-10-CM | POA: Diagnosis not present

## 2017-04-04 DIAGNOSIS — H01022 Squamous blepharitis right lower eyelid: Secondary | ICD-10-CM | POA: Diagnosis not present

## 2017-04-04 NOTE — Telephone Encounter (Signed)
Marion, can you help! 

## 2017-04-16 ENCOUNTER — Encounter: Payer: Self-pay | Admitting: Primary Care

## 2017-04-16 ENCOUNTER — Telehealth: Payer: Self-pay | Admitting: Primary Care

## 2017-04-16 ENCOUNTER — Other Ambulatory Visit: Payer: Self-pay | Admitting: *Deleted

## 2017-04-16 DIAGNOSIS — M8589 Other specified disorders of bone density and structure, multiple sites: Secondary | ICD-10-CM | POA: Diagnosis not present

## 2017-04-16 DIAGNOSIS — I10 Essential (primary) hypertension: Secondary | ICD-10-CM

## 2017-04-16 MED ORDER — LOSARTAN POTASSIUM 50 MG PO TABS: 50.0000 mg | ORAL_TABLET | Freq: Every day | ORAL | 3 refills | Status: DC

## 2017-04-16 MED ORDER — LOVASTATIN 20 MG PO TABS: 20.0000 mg | ORAL_TABLET | Freq: Every day | ORAL | 3 refills | Status: DC

## 2017-04-16 NOTE — Telephone Encounter (Signed)
Copied from [place] [id number]. Topic: Quick Communication - Rx Refill/Question >> Apr 16, 2017 10:45 AM Yvette Rack wrote: Has the patient contacted their pharmacy? Yes.     (Agent: If no, request that the patient contact the pharmacy for the refill.)losartan (COZAAR) 50 MG tablet  lovastatin (MEVACOR) 20 MG tablet   Preferred Pharmacy (with phone number or street name): [place], [place] [phone] (Phone) [phone] (Fax)     Agent: Please be advised that RX refills may take up to 3 business days. We ask that you follow-up with your pharmacy.

## 2017-04-16 NOTE — Telephone Encounter (Signed)
Rx refilled per annual exam visit 12/20- per protocol

## 2017-04-19 ENCOUNTER — Encounter: Payer: Self-pay | Admitting: Primary Care

## 2017-05-13 ENCOUNTER — Encounter: Payer: Self-pay | Admitting: Primary Care

## 2017-06-13 ENCOUNTER — Other Ambulatory Visit: Payer: Self-pay | Admitting: Primary Care

## 2017-06-13 DIAGNOSIS — I10 Essential (primary) hypertension: Secondary | ICD-10-CM

## 2017-08-12 ENCOUNTER — Ambulatory Visit (INDEPENDENT_AMBULATORY_CARE_PROVIDER_SITE_OTHER): Payer: Medicare Other | Admitting: Family Medicine

## 2017-08-12 ENCOUNTER — Encounter: Payer: Self-pay | Admitting: Family Medicine

## 2017-08-12 VITALS — BP 128/70 | HR 64 | Temp 97.5°F | Ht 66.25 in | Wt 152.0 lb

## 2017-08-12 DIAGNOSIS — H029 Unspecified disorder of eyelid: Secondary | ICD-10-CM

## 2017-08-12 DIAGNOSIS — M159 Polyosteoarthritis, unspecified: Secondary | ICD-10-CM

## 2017-08-12 DIAGNOSIS — M150 Primary generalized (osteo)arthritis: Secondary | ICD-10-CM

## 2017-08-12 NOTE — Patient Instructions (Signed)
If eye lesion does not resolve, you can follow up with your eye doctor  Stretch out your neck several times a day, can use ibuprofen 2 tablets sparingly  Try to increase your exercise- even walking 15-20 minutes is helpful.

## 2017-08-12 NOTE — Progress Notes (Signed)
Subjective:    Patient ID: Rachel Bowers, female    DOB: 04/10/1940, 77 y.o.   MRN: [id number]  HPI This is a 77 yo female who presents today with bump on her right eye x 1 month. Noticed while on [place] for vacation and was having allergy symptoms of runny nose, watery eyes. The area is not painful. She thinks it has decreased in size.   Intermittent stiffness in right hand, neck. Worse after using [employer]. No regular exercise, taking care of chronically ill husband. Felt better when she went to the [facility]. Takes acetaminophen 1000 daily and occasionally another dose in evening. Rarely takes ibuprofen. Just started tumeric supplements today.   Past Medical History:  Diagnosis Date  . Arthritis   . 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 Diverticulosis    per colonoscopy  . Heart murmur   . Hyperlipidemia   . Hypertension    Past Surgical History:  Procedure Laterality Date  . ABDOMINAL HYSTERECTOMY  1982  . APPENDECTOMY  2008   Family History  Problem Relation Age of Onset  . Heart disease Mother   . Hyperlipidemia Mother   . Hypertension Mother   . Heart attack Mother   . Lung cancer Brother    Social History   Tobacco Use  . Smoking status: Former Smoker    Types: Cigarettes    Last attempt to quit: 01/25/1974    Years since quitting: 43.5  . Smokeless tobacco: Never Used  Substance Use Topics  . Alcohol use: Yes    Alcohol/week: 0.6 oz    Types: 1 Glasses of wine per week  . Drug use: No      Review of Systems Per HPI    Objective:   Physical Exam  Constitutional: She appears well-developed and well-nourished.  HENT:  Head: Normocephalic and atraumatic.  Eyes: Conjunctivae are normal.    Neck: Normal range of motion. Neck supple.  Cardiovascular: Normal rate.  Pulmonary/Chest: Effort normal.  Musculoskeletal:       Cervical back: She exhibits bony tenderness. She exhibits normal range of motion, no tenderness, no swelling and no edema.   Right hand: She exhibits deformity. She exhibits normal range of motion. Normal strength noted.  Bilateral hands with Heberden's nodes, more prominent on 2,3 finger PIPs.   Skin: Skin is warm and dry.  Psychiatric: She has a normal mood and affect. Her behavior is normal. Judgment and thought content normal.  Vitals reviewed.     BP 128/70 (BP Location: Right Arm, Patient Position: Sitting, Cuff Size: Normal)   Pulse 64   Temp (!) 97.5 F (36.4 C) (Oral)   Ht 5' 6.25" (1.683 m)   Wt 152 lb (68.9 kg)   SpO2 96%   BMI 24.35 kg/m  Wt Readings from Last 3 Encounters:  08/12/17 152 lb (68.9 kg)  03/28/17 151 lb 6.4 oz (68.7 kg)  03/27/17 151 lb 4 oz (68.6 kg)       Assessment & Plan:  1. Lesion of right eyelid - Discussed options of seeing her opthalmologist or dermatologist or watchful waiting since lesion seems to be getting smaller.  - patient desires to follow up with her eye care provider if no improvement in 1-2 weeks  2. Primary osteoarthritis involving multiple joints - discussed supplements, importance of exercise, use of acetaminophen and occasional use ibuprofen at low dose (400 mg), heat prn.  - follow up with PCP prn   Clarene Reamer, FNP-BC  [facility]  08/12/2017 9:47 AM

## 2018-01-09 ENCOUNTER — Ambulatory Visit (INDEPENDENT_AMBULATORY_CARE_PROVIDER_SITE_OTHER): Payer: Medicare Other

## 2018-01-09 DIAGNOSIS — Z23 Encounter for immunization: Secondary | ICD-10-CM | POA: Diagnosis not present

## 2018-01-21 DIAGNOSIS — I10 Essential (primary) hypertension: Secondary | ICD-10-CM

## 2018-01-21 MED ORDER — IRBESARTAN 150 MG PO TABS: ORAL_TABLET | ORAL | 0 refills | Status: DC

## 2018-01-28 ENCOUNTER — Telehealth: Payer: Self-pay | Admitting: Primary Care

## 2018-01-28 ENCOUNTER — Other Ambulatory Visit: Payer: Self-pay | Admitting: Primary Care

## 2018-01-28 DIAGNOSIS — I10 Essential (primary) hypertension: Secondary | ICD-10-CM

## 2018-01-28 NOTE — Telephone Encounter (Signed)
Medication is not currently on medication list.

## 2018-01-28 NOTE — Telephone Encounter (Signed)
Copied from [place] [id number]. Topic: Quick Communication - Rx Refill/Question >> Jan 28, 2018  1:06 PM Judyann Munson wrote: Medication: losartan (COZAAR) 50 MG tablet   Has the patient contacted their pharmacy? Yes   Preferred Pharmacy (with phone number or street name): [facility], [place] [phone] (Phone) [phone] (Fax)  Agent: Please be advised that RX refills may take up to 3 business days. We ask that you follow-up with your pharmacy.

## 2018-01-28 NOTE — Telephone Encounter (Signed)
Losartan recalled/discontinued and patient was switched to irbesartan.

## 2018-01-30 DIAGNOSIS — I10 Essential (primary) hypertension: Secondary | ICD-10-CM

## 2018-01-30 MED ORDER — IRBESARTAN 150 MG PO TABS: ORAL_TABLET | ORAL | 0 refills | Status: DC

## 2018-03-14 DIAGNOSIS — Z1231 Encounter for screening mammogram for malignant neoplasm of breast: Secondary | ICD-10-CM | POA: Diagnosis not present

## 2018-03-14 LAB — HM MAMMOGRAPHY

## 2018-03-18 ENCOUNTER — Encounter: Payer: Self-pay | Admitting: Primary Care

## 2018-03-21 ENCOUNTER — Other Ambulatory Visit: Payer: Self-pay | Admitting: Primary Care

## 2018-03-21 DIAGNOSIS — E559 Vitamin D deficiency, unspecified: Secondary | ICD-10-CM

## 2018-03-21 DIAGNOSIS — E785 Hyperlipidemia, unspecified: Secondary | ICD-10-CM

## 2018-03-21 DIAGNOSIS — I10 Essential (primary) hypertension: Secondary | ICD-10-CM

## 2018-03-28 ENCOUNTER — Ambulatory Visit (INDEPENDENT_AMBULATORY_CARE_PROVIDER_SITE_OTHER): Payer: Medicare Other

## 2018-03-28 VITALS — BP 112/64 | HR 73 | Temp 97.9°F | Ht 67.5 in | Wt 149.8 lb

## 2018-03-28 DIAGNOSIS — E559 Vitamin D deficiency, unspecified: Secondary | ICD-10-CM

## 2018-03-28 DIAGNOSIS — E785 Hyperlipidemia, unspecified: Secondary | ICD-10-CM | POA: Diagnosis not present

## 2018-03-28 DIAGNOSIS — I10 Essential (primary) hypertension: Secondary | ICD-10-CM | POA: Diagnosis not present

## 2018-03-28 DIAGNOSIS — Z0000 Encounter for general adult medical examination without abnormal findings: Secondary | ICD-10-CM

## 2018-03-28 LAB — COMPREHENSIVE METABOLIC PANEL
ALBUMIN: 4.4 g/dL (ref 3.5–5.2)
ALT: 20 U/L (ref 0–35)
AST: 15 U/L (ref 0–37)
Alkaline Phosphatase: 47 U/L (ref 39–117)
BILIRUBIN TOTAL: 0.8 mg/dL (ref 0.2–1.2)
BUN: 19 mg/dL (ref 6–23)
CALCIUM: 9.8 mg/dL (ref 8.4–10.5)
CO2: 29 mEq/L (ref 19–32)
CREATININE: 0.81 mg/dL (ref 0.40–1.20)
Chloride: 104 mEq/L (ref 96–112)
GFR: 72.83 mL/min (ref >60.00)
Glucose, Bld: 102 mg/dL — ABNORMAL HIGH (ref 70–99)
Potassium: 4.1 mEq/L (ref 3.5–5.1)
Sodium: 140 mEq/L (ref 135–145)
Total Protein: 7 g/dL (ref 6.0–8.3)

## 2018-03-28 LAB — VITAMIN D 25 HYDROXY (VIT D DEFICIENCY, FRACTURES): VITD: 72.17 ng/mL (ref 30.00–100.00)

## 2018-03-28 LAB — LIPID PANEL
CHOLESTEROL: 161 mg/dL (ref 0–200)
HDL: 63.3 mg/dL (ref >39.00)
LDL Cholesterol: 84 mg/dL (ref 0–99)
NonHDL: 97.42
TRIGLYCERIDES: 68 mg/dL (ref 0.0–149.0)
Total CHOL/HDL Ratio: 3
VLDL: 13.6 mg/dL (ref 0.0–40.0)

## 2018-03-28 NOTE — Progress Notes (Signed)
PCP notes:   Health maintenance:  No gaps identified.  Abnormal screenings:   None  Patient concerns:   Patient reports intermittent pain in left hip that radiates down towards lower extremity. Occurs mostly at bedtime around 2AM.   Patient has painless round nodules present in palm of left hand.   Nurse concerns:  None  Next PCP appt:   03/31/18 @ 0840

## 2018-03-28 NOTE — Patient Instructions (Signed)
Rachel Bowers , Thank you for taking time to come for your Medicare Wellness Visit. I appreciate your ongoing commitment to your health goals. Please review the following plan we discussed and let me know if I can assist you in the future.   These are the goals we discussed: Goals    . Increase physical activity     Starting 03/28/2018, I will continue to walk for 30 minutes 5 days per week.        This is a list of the screening recommended for you and due dates:  Health Maintenance  Topic Date Due  . Tetanus Vaccine  02/18/2023  . Flu Shot  Completed  . DEXA scan (bone density measurement)  Completed  . Pneumonia vaccines  Completed   Preventive Care for Adults  A healthy lifestyle and preventive care can promote health and wellness. Preventive health guidelines for adults include the following key practices.  . A routine yearly physical is a good way to check with your health care provider about your health and preventive screening. It is a chance to share any concerns and updates on your health and to receive a thorough exam.  . Visit your dentist for a routine exam and preventive care every 6 months. Brush your teeth twice a day and floss once a day. Good oral hygiene prevents tooth decay and gum disease.  . The frequency of eye exams is based on your age, health, family medical history, use  of contact lenses, and other factors. Follow your health care provider's recommendations for frequency of eye exams.  . Eat a healthy diet. Foods like vegetables, fruits, whole grains, low-fat dairy products, and lean protein foods contain the nutrients you need without too many calories. Decrease your intake of foods high in solid fats, added sugars, and salt. Eat the right amount of calories for you. Get information about a proper diet from your health care provider, if necessary.  . Regular physical exercise is one of the most important things you can do for your health. Most adults should  get at least 150 minutes of moderate-intensity exercise (any activity that increases your heart rate and causes you to sweat) each week. In addition, most adults need muscle-strengthening exercises on 2 or more days a week.  Silver Sneakers may be a benefit available to you. To determine eligibility, you may visit the website: www.silversneakers.com or contact program at 330-199-8356 Mon-Fri between 8AM-8PM.   . Maintain a healthy weight. The body mass index (BMI) is a screening tool to identify possible weight problems. It provides an estimate of body fat based on height and weight. Your health care provider can find your BMI and can help you achieve or maintain a healthy weight.   For adults 20 years and older: ? A BMI below 18.5 is considered underweight. ? A BMI of 18.5 to 24.9 is normal. ? A BMI of 25 to 29.9 is considered overweight. ? A BMI of 30 and above is considered obese.   . Maintain normal blood lipids and cholesterol levels by exercising and minimizing your intake of saturated fat. Eat a balanced diet with plenty of fruit and vegetables. Blood tests for lipids and cholesterol should begin at age 69 and be repeated every 5 years. If your lipid or cholesterol levels are high, you are over 50, or you are at high risk for heart disease, you may need your cholesterol levels checked more frequently. Ongoing high lipid and cholesterol levels should be treated with  medicines if diet and exercise are not working.  . If you smoke, find out from your health care provider how to quit. If you do not use tobacco, please do not start.  . If you choose to drink alcohol, please do not consume more than 2 drinks per day. One drink is considered to be 12 ounces (355 mL) of beer, 5 ounces (148 mL) of wine, or 1.5 ounces (44 mL) of liquor.  . If you are 62-32 years old, ask your health care provider if you should take aspirin to prevent strokes.  . Use sunscreen. Apply sunscreen liberally and  repeatedly throughout the day. You should seek shade when your shadow is shorter than you. Protect yourself by wearing long sleeves, pants, a wide-brimmed hat, and sunglasses year round, whenever you are outdoors.  . Once a month, do a whole body skin exam, using a mirror to look at the skin on your back. Tell your health care provider of new moles, moles that have irregular borders, moles that are larger than a pencil eraser, or moles that have changed in shape or color.

## 2018-03-28 NOTE — Progress Notes (Signed)
Subjective:   Rachel Bowers is a 77 y.o. female who presents for Medicare Annual (Subsequent) preventive examination.  Review of Systems:  N/A Cardiac Risk Factors include: advanced age (>9men, >38 women);dyslipidemia;hypertension     Objective:     Vitals: BP 112/64 (BP Location: Right Arm, Patient Position: Sitting, Cuff Size: Normal)   Pulse 73   Temp 97.9 F (36.6 C) (Oral)   Ht 5' 7.5" (1.715 m) Comment: shoes  Wt 149 lb 12 oz (67.9 kg)   SpO2 98%   BMI 23.11 kg/m   Body mass index is 23.11 kg/m.  Advanced Directives 03/28/2018 03/27/2017 01/31/2015 01/25/2014  Does Patient Have a Medical Advance Directive? Yes Yes Yes Yes  Type of Paramedic of [place];Living will [place];Living will [place];Living will [place];Living will  Does patient want to make changes to medical advance directive? - - No - Patient declined -  Copy of [place] in Chart? No - copy requested No - copy requested No - copy requested No - copy requested    Tobacco Social History   Tobacco Use  Smoking Status Former Smoker  . Types: Cigarettes  . Last attempt to quit: 01/25/1974  . Years since quitting: 44.2  Smokeless Tobacco Never Used     Counseling given: No   Clinical Intake:  Pre-visit preparation completed: Yes  Pain : No/denies pain Pain Score: 0-No pain     Nutritional Status: BMI 25 -29 Overweight Nutritional Risks: None Diabetes: No  How often do you need to have someone help you when you read instructions, pamphlets, or other written materials from your doctor or pharmacy?: 1 - Never What is the last grade level you completed in school?: 12th grade  Interpreter Needed?: No  Comments: pt lives with spouse Information entered by :: LPinson, LPN  Past Medical History:  Diagnosis Date  . Arthritis   . 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 Diverticulosis    per colonoscopy  . Heart murmur   . Hyperlipidemia   . Hypertension    Past Surgical History:  Procedure Laterality Date  . ABDOMINAL HYSTERECTOMY  1982  . APPENDECTOMY  2008   Family History  Problem Relation Age of Onset  . Heart disease Mother   . Hyperlipidemia Mother   . Hypertension Mother   . Heart attack Mother   . Lung cancer Brother    Social History   Socioeconomic History  . Marital status: Married    Spouse name: Not on file  . Number of children: Not on file  . Years of education: Not on file  . Highest education level: Not on file  Occupational History  . Occupation: retired  Scientific laboratory technician  . Financial resource strain: Not on file  . Food insecurity:    Worry: Not on file    Inability: Not on file  . Transportation needs:    Medical: Not on file    Non-medical: Not on file  Tobacco Use  . Smoking status: Former Smoker    Types: Cigarettes    Last attempt to quit: 01/25/1974    Years since quitting: 44.2  . Smokeless tobacco: Never Used  Substance and Sexual Activity  . Alcohol use: Yes    Alcohol/week: 1.0 standard drinks    Types: 1 Glasses of wine per week  . Drug use: No  . Sexual activity: Not on file  Lifestyle  . Physical  activity:    Days per week: Not on file    Minutes per session: Not on file  . Stress: Not on file  Relationships  . Social connections:    Talks on phone: Not on file    Gets together: Not on file    Attends religious service: Not on file    Active member of club or organization: Not on file    Attends meetings of clubs or organizations: Not on file    Relationship status: Not on file  Other Topics Concern  . Not on file  Social History Narrative   Married.   1 daughter and 1 son. 2 grandchildren.   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   Enjoys exercising at the [facility], reading.     Outpatient Encounter Medications as of 03/28/2018  Medication Sig  . amLODipine  (NORVASC) 5 MG tablet TAKE 1 TABLET BY MOUTH  DAILY  . aspirin 81 MG tablet Take 81 mg by mouth daily.  Marland Kitchen BIOTIN PO Take by mouth.  . Calcium Carbonate-Vitamin D (CALCIUM-VITAMIN D3 PO) Take 1,000 mg by mouth daily.  . Cholecalciferol (VITAMIN D3) 10000 units TABS Take by mouth.  . irbesartan (AVAPRO) 150 MG tablet Take 1 tablet by mouth once daily for blood pressure.  . loratadine (CLARITIN) 10 MG tablet Take 10 mg by mouth daily.  Marland Kitchen lovastatin (MEVACOR) 20 MG tablet Take 1 tablet (20 mg total) by mouth at bedtime.  . Magnesium 500 MG CAPS Take 500 mg by mouth daily.  . Omega-3 Fatty Acids (FISH OIL) 1000 MG CAPS Take 1 capsule by mouth daily.   No facility-administered encounter medications on file as of 03/28/2018.     Activities of Daily Living In your present state of health, do you have any difficulty performing the following activities: 03/28/2018  Hearing? N  Vision? N  Difficulty concentrating or making decisions? N  Walking or climbing stairs? N  Dressing or bathing? N  Doing errands, shopping? N  Preparing Food and eating ? N  Using the Toilet? N  In the past six months, have you accidently leaked urine? N  Do you have problems with loss of bowel control? N  Managing your Medications? N  Managing your Finances? N  Housekeeping or managing your Housekeeping? N  Some recent data might be hidden    Patient Care Team: Pleas Koch, NP as PCP - General (Internal Medicine)    Assessment:   This is a routine wellness examination for Rachel Bowers.  Exercise Activities and Dietary recommendations Current Exercise Habits: Home exercise routine, Type of exercise: walking, Time (Minutes): 30, Frequency (Times/Week): 5, Weekly Exercise (Minutes/Week): 150, Intensity: Moderate, Exercise limited by: None identified  Goals    . Increase physical activity     Starting 03/28/2018, I will continue to walk for 30 minutes 5 days per week.        Fall Risk Fall Risk  03/28/2018  03/27/2017 03/21/2016  Falls in the past year? 1 No No  Comment loss balance and fell on buttocks - -  Number falls in past yr: 0 - -  Injury with Fall? 1 - -  Comment lacerations to hands - -   Depression Screen PHQ 2/9 Scores 03/28/2018 03/27/2017 03/21/2016  PHQ - 2 Score 0 2 1  PHQ- 9 Score 0 5 -     Cognitive Function MMSE - Mini Mental State Exam 03/28/2018 03/27/2017  Orientation to time 5  5  Orientation to Place 5 5  Registration 3 3  Attention/ Calculation 0 0  Recall 3 3  Language- name 2 objects 0 0  Language- repeat 1 1  Language- follow 3 step command 3 3  Language- read & follow direction 0 0  Write a sentence 0 0  Copy design 0 0  Total score 20 20     PLEASE NOTE: A Mini-Cog screen was completed. Maximum score is 20. A value of 0 denotes this part of Folstein MMSE was not completed or the patient failed this part of the Mini-Cog screening.   Mini-Cog Screening Orientation to Time - Max 5 pts Orientation to Place - Max 5 pts Registration - Max 3 pts Recall - Max 3 pts Language Repeat - Max 1 pts Language Follow 3 Step Command - Max 3 pts     Immunization History  Administered Date(s) Administered  . Influenza,inj,Quad PF,6+ Mos 12/30/2015, 01/11/2017, 01/09/2018  . Influenza-Unspecified 12/24/2014  . Pneumococcal Conjugate-13 02/22/2014  . Pneumococcal Polysaccharide-23 01/14/2006, 02/06/2008  . Tdap 02/17/2013  . Zoster 02/12/2011    Screening Tests Health Maintenance  Topic Date Due  . TETANUS/TDAP  02/18/2023  . INFLUENZA VACCINE  Completed  . DEXA SCAN  Completed  . PNA vac Low Risk Adult  Completed      Plan:     I have personally reviewed, addressed, and noted the following in the patient's chart:  A. Medical and social history B. Use of alcohol, tobacco or illicit drugs  C. Current medications and supplements D. Functional ability and status E.  Nutritional status F.  Physical activity G. Advance directives H. List of other  physicians I.  Hospitalizations, surgeries, and ER visits in previous 12 months J.  [place] to include hearing, vision, cognitive, depression L. Referrals and appointments - none  In addition, I have reviewed and discussed with patient certain preventive protocols, quality metrics, and best practice recommendations. A written personalized care plan for preventive services as well as general preventive health recommendations were provided to patient.  See attached scanned questionnaire for additional information.   Signed,   Lindell Noe, MHA, BS, LPN Health Coach

## 2018-03-30 NOTE — Progress Notes (Signed)
I reviewed health advisor's note, was available for consultation, and agree with documentation and plan.  

## 2018-03-31 ENCOUNTER — Ambulatory Visit (INDEPENDENT_AMBULATORY_CARE_PROVIDER_SITE_OTHER): Payer: Medicare Other | Admitting: Primary Care

## 2018-03-31 ENCOUNTER — Encounter: Payer: Self-pay | Admitting: Primary Care

## 2018-03-31 VITALS — BP 120/76 | HR 69 | Temp 97.8°F | Ht 66.25 in | Wt 149.8 lb

## 2018-03-31 DIAGNOSIS — E559 Vitamin D deficiency, unspecified: Secondary | ICD-10-CM

## 2018-03-31 DIAGNOSIS — Z0000 Encounter for general adult medical examination without abnormal findings: Secondary | ICD-10-CM | POA: Diagnosis not present

## 2018-03-31 DIAGNOSIS — I6529 Occlusion and stenosis of unspecified carotid artery: Secondary | ICD-10-CM

## 2018-03-31 DIAGNOSIS — I10 Essential (primary) hypertension: Secondary | ICD-10-CM

## 2018-03-31 DIAGNOSIS — M150 Primary generalized (osteo)arthritis: Secondary | ICD-10-CM

## 2018-03-31 DIAGNOSIS — M5442 Lumbago with sciatica, left side: Secondary | ICD-10-CM | POA: Diagnosis not present

## 2018-03-31 DIAGNOSIS — M545 Low back pain, unspecified: Secondary | ICD-10-CM | POA: Insufficient documentation

## 2018-03-31 DIAGNOSIS — E785 Hyperlipidemia, unspecified: Secondary | ICD-10-CM

## 2018-03-31 DIAGNOSIS — G8929 Other chronic pain: Secondary | ICD-10-CM | POA: Insufficient documentation

## 2018-03-31 DIAGNOSIS — M159 Polyosteoarthritis, unspecified: Secondary | ICD-10-CM

## 2018-03-31 NOTE — Assessment & Plan Note (Signed)
Chronic and waking up nightly. She declines plain films today. Will start with Ibuprofen 600 mg BID, stretching exercises provided. She will update, consider plain films and PT at that time.

## 2018-03-31 NOTE — Patient Instructions (Signed)
Continue exercising. You should be getting 150 minutes of exercise weekly.  Continue taking Calcium and Vitamin D.  You may take Ibuprofen 600 mg twice daily as needed for back pain. Do not exceed 2400 mg in 24 hours or 3000 mg of Tylenol in 24 hours.  Stretch your lower back every evening before bed.  Please follow up with me if your back pain persists.  It was a pleasure to see you today!   Preventive Care 40 Years and Older, Female Preventive care refers to lifestyle choices and visits with your health care provider that can promote health and wellness. What does preventive care include?  A yearly physical exam. This is also called an annual well check.  Dental exams once or twice a year.  Routine eye exams. Ask your health care provider how often you should have your eyes checked.  Personal lifestyle choices, including: ? Daily care of your teeth and gums. ? Regular physical activity. ? Eating a healthy diet. ? Avoiding tobacco and drug use. ? Limiting alcohol use. ? Practicing safe sex. ? Taking low-dose aspirin every day. ? Taking vitamin and mineral supplements as recommended by your health care provider. What happens during an annual well check? The services and screenings done by your health care provider during your annual well check will depend on your age, overall health, lifestyle risk factors, and family history of disease. Counseling Your health care provider may ask you questions about your:  Alcohol use.  Tobacco use.  Drug use.  Emotional well-being.  Home and relationship well-being.  Sexual activity.  Eating habits.  History of falls.  Memory and ability to understand (cognition).  Work and work Statistician.  Reproductive health.  Screening You may have the following tests or measurements:  Height, weight, and BMI.  Blood pressure.  Lipid and cholesterol levels. These may be checked every 5 years, or more frequently if you are over  24 years old.  Skin check.  Lung cancer screening. You may have this screening every year starting at age 73 if you have a 30-pack-year history of smoking and currently smoke or have quit within the past 15 years.  Colorectal cancer screening. All adults should have this screening starting at age 33 and continuing until age 29. You will have tests every 1-10 years, depending on your results and the type of screening test. People at increased risk should start screening at an earlier age. Screening tests may include: ? Guaiac-based fecal occult blood testing. ? Fecal immunochemical test (FIT). ? Stool DNA test. ? Virtual colonoscopy. ? Sigmoidoscopy. During this test, a flexible tube with a tiny camera (sigmoidoscope) is used to examine your rectum and lower colon. The sigmoidoscope is inserted through your anus into your rectum and lower colon. ? Colonoscopy. During this test, a long, thin, flexible tube with a tiny camera (colonoscope) is used to examine your entire colon and rectum.  Hepatitis C blood test.  Hepatitis B blood test.  Sexually transmitted disease (STD) testing.  Diabetes screening. This is done by checking your blood sugar (glucose) after you have not eaten for a while (fasting). You may have this done every 1-3 years.  Bone density scan. This is done to screen for osteoporosis. You may have this done starting at age 15.  Mammogram. This may be done every 1-2 years. Talk to your health care provider about how often you should have regular mammograms. Talk with your health care provider about your test results, treatment options, and  if necessary, the need for more tests. Vaccines Your health care provider may recommend certain vaccines, such as:  Influenza vaccine. This is recommended every year.  Tetanus, diphtheria, and acellular pertussis (Tdap, Td) vaccine. You may need a Td booster every 10 years.  Varicella vaccine. You may need this if you have not been  vaccinated.  Zoster vaccine. You may need this after age 73.  Measles, mumps, and rubella (MMR) vaccine. You may need at least one dose of MMR if you were born in 1957 or later. You may also need a second dose.  Pneumococcal 13-valent conjugate (PCV13) vaccine. One dose is recommended after age 39.  Pneumococcal polysaccharide (PPSV23) vaccine. One dose is recommended after age 16.  Meningococcal vaccine. You may need this if you have certain conditions.  Hepatitis A vaccine. You may need this if you have certain conditions or if you travel or work in places where you may be exposed to hepatitis A.  Hepatitis B vaccine. You may need this if you have certain conditions or if you travel or work in places where you may be exposed to hepatitis B.  Haemophilus influenzae type b (Hib) vaccine. You may need this if you have certain conditions. Talk to your health care provider about which screenings and vaccines you need and how often you need them. This information is not intended to replace advice given to you by your health care provider. Make sure you discuss any questions you have with your health care provider. Document Released: 04/22/2015 Document Revised: 05/16/2017 Document Reviewed: 01/25/2015 Elsevier Interactive Patient Education  2019 [employer].

## 2018-03-31 NOTE — Assessment & Plan Note (Addendum)
Compliant to aspirin and lovastatin. No bruit on exam. Continue to monitor.

## 2018-03-31 NOTE — Assessment & Plan Note (Signed)
Stable on current regimen, continue same.

## 2018-03-31 NOTE — Assessment & Plan Note (Signed)
Compliant to Amlodipine and Irbesartan, continue same. BMP reviewed.

## 2018-03-31 NOTE — Assessment & Plan Note (Signed)
Chronic, recommended Ibuprofen for her lower back, stretching, exercise. Continue to monitor.

## 2018-03-31 NOTE — Progress Notes (Signed)
Subjective:    Patient ID: Rachel Bowers, female    DOB: 08/12/1940, 77 y.o.   MRN: [id number]  HPI  Rachel Bowers is a 77 year old female who presents today for complete physical.  Chronic lower back pain x 2-3 months with radiation of pain and numbness down left lower extremity. Waking up around 2-3 am with pain, has to get out of bed to walk around, some improvement. She was once taking Tylenol but this became ineffective. She denies symptoms during the day.   Immunizations: -Tetanus: Completed in 2014 -Influenza: Completed this season -Pneumonia: Completed in 2015, 2009, 2007 -Shingles: Completed Zostavax in 2012   Diet: She endorses a fair diet Breakfast: Eggs, biscuit, cereal, fruit Lunch: Beans, protein, starch, vegetables  Dinner: Fruit, cheese and crackers, yogurt, popcorn Snacks: None Desserts: None Beverages: Coffee, milk, water, diet soda occasionally   Exercise: Walking 5 days weekly, 30 minutes at a time Eye exam: Due this week Dental exam: Semi-annually  Colonoscopy: Completed in 2016 Dexa: Completed in 2019, osteopenia. Compliant to calcium and vitamin D.  Mammogram: Completed in December 2019  BP Readings from Last 3 Encounters:  03/31/18 120/76  03/28/18 112/64  08/12/17 128/70     Review of Systems  Constitutional: Negative for unexpected weight change.  HENT: Negative for rhinorrhea.   Respiratory: Negative for cough and shortness of breath.   Cardiovascular: Negative for chest pain.  Gastrointestinal: Negative for constipation and diarrhea.  Genitourinary: Negative for difficulty urinating.  Musculoskeletal: Positive for arthralgias.  Skin: Negative for rash.  Allergic/Immunologic: Negative for environmental allergies.  Neurological: Negative for dizziness, numbness and headaches.  Psychiatric/Behavioral:       Stress with her sickly husband, overall able to manage       Past Medical History:  Diagnosis Date  . Arthritis   . 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 Diverticulosis    per colonoscopy  . Heart murmur   . Hyperlipidemia   . Hypertension      Social History   Socioeconomic History  . Marital status: Married    Spouse name: Not on file  . Number of children: Not on file  . Years of education: Not on file  . Highest education level: Not on file  Occupational History  . Occupation: retired  Scientific laboratory technician  . Financial resource strain: Not on file  . Food insecurity:    Worry: Not on file    Inability: Not on file  . Transportation needs:    Medical: Not on file    Non-medical: Not on file  Tobacco Use  . Smoking status: Former Smoker    Types: Cigarettes    Last attempt to quit: 01/25/1974    Years since quitting: 44.2  . Smokeless tobacco: Never Used  Substance and Sexual Activity  . Alcohol use: Yes    Alcohol/week: 1.0 standard drinks    Types: 1 Glasses of wine per week  . Drug use: No  . Sexual activity: Not on file  Lifestyle  . Physical activity:    Days per week: Not on file    Minutes per session: Not on file  . Stress: Not on file  Relationships  . Social connections:    Talks on phone: Not on file    Gets together: Not on file    Attends religious service: Not on file    Active member of club or organization: Not on file    Attends meetings of clubs  or organizations: Not on file    Relationship status: Not on file  . Intimate partner violence:    Fear of current or ex partner: Not on file    Emotionally abused: Not on file    Physically abused: Not on file    Forced sexual activity: Not on file  Other Topics Concern  . Not on file  Social History Narrative   Married.   1 daughter and 1 son. 2 grandchildren.   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   Enjoys exercising at the [facility], reading.     Past Surgical History:  Procedure Laterality Date  . ABDOMINAL HYSTERECTOMY  1982  . APPENDECTOMY  2008    Family History  Problem  Relation Age of Onset  . Heart disease Mother   . Hyperlipidemia Mother   . Hypertension Mother   . Heart attack Mother   . Lung cancer Brother     Allergies  Allergen Reactions  . Penicillins Hives    Current Outpatient Medications on File Prior to Visit  Medication Sig Dispense Refill  . amLODipine (NORVASC) 5 MG tablet TAKE 1 TABLET BY MOUTH  DAILY 90 tablet 0  . aspirin 81 MG tablet Take 81 mg by mouth daily.    Marland Kitchen BIOTIN PO Take by mouth.    . Calcium Carbonate-Vitamin D (CALCIUM-VITAMIN D3 PO) Take 1,000 mg by mouth daily.    . Cholecalciferol (VITAMIN D3) 10000 units TABS Take by mouth.    . irbesartan (AVAPRO) 150 MG tablet Take 1 tablet by mouth once daily for blood pressure. 90 tablet 0  . loratadine (CLARITIN) 10 MG tablet Take 10 mg by mouth daily.    Marland Kitchen lovastatin (MEVACOR) 20 MG tablet Take 1 tablet (20 mg total) by mouth at bedtime. 90 tablet 3  . Magnesium 500 MG CAPS Take 500 mg by mouth daily.    . Omega-3 Fatty Acids (FISH OIL) 1000 MG CAPS Take 1 capsule by mouth daily.     No current facility-administered medications on file prior to visit.     BP 120/76   Pulse 69   Temp 97.8 F (36.6 C) (Oral)   Ht 5' 6.25" (1.683 m)   Wt 149 lb 12 oz (67.9 kg)   SpO2 98%   BMI 23.99 kg/m    Objective:   Physical Exam  Constitutional: She is oriented to person, place, and time. She appears well-nourished.  HENT:  Mouth/Throat: No oropharyngeal exudate.  Eyes: Pupils are equal, round, and reactive to light. EOM are normal.  Neck: Neck supple. No thyromegaly present.  Cardiovascular: Normal rate and regular rhythm.  Respiratory: Effort normal and breath sounds normal.  GI: Soft. Bowel sounds are normal. There is no abdominal tenderness.  Musculoskeletal: Normal range of motion.  Neurological: She is alert and oriented to person, place, and time.  Skin: Skin is warm and dry.  Psychiatric: She has a normal mood and affect.           Assessment & Plan:

## 2018-03-31 NOTE — Assessment & Plan Note (Signed)
Stable on recent labs, continue lovastatin.

## 2018-03-31 NOTE — Assessment & Plan Note (Signed)
Immunizations UTD. Colonoscopy UTD. Mammogram and bone density scans UTD. Exam stable. Labs reviewed. Recommended continued exercise, healthy diet. Follow up in 1 year for CPE.

## 2018-04-03 DIAGNOSIS — H11442 Conjunctival cysts, left eye: Secondary | ICD-10-CM | POA: Diagnosis not present

## 2018-04-03 DIAGNOSIS — D3132 Benign neoplasm of left choroid: Secondary | ICD-10-CM | POA: Diagnosis not present

## 2018-04-03 DIAGNOSIS — H43391 Other vitreous opacities, right eye: Secondary | ICD-10-CM | POA: Diagnosis not present

## 2018-04-03 DIAGNOSIS — H35372 Puckering of macula, left eye: Secondary | ICD-10-CM | POA: Diagnosis not present

## 2018-04-03 DIAGNOSIS — H2513 Age-related nuclear cataract, bilateral: Secondary | ICD-10-CM | POA: Diagnosis not present

## 2018-04-22 ENCOUNTER — Other Ambulatory Visit: Payer: Self-pay | Admitting: Primary Care

## 2018-04-22 DIAGNOSIS — I10 Essential (primary) hypertension: Secondary | ICD-10-CM

## 2018-05-25 ENCOUNTER — Other Ambulatory Visit: Payer: Self-pay | Admitting: Primary Care

## 2018-05-25 DIAGNOSIS — I10 Essential (primary) hypertension: Secondary | ICD-10-CM

## 2018-09-22 DIAGNOSIS — D3132 Benign neoplasm of left choroid: Secondary | ICD-10-CM | POA: Diagnosis not present

## 2018-11-11 ENCOUNTER — Other Ambulatory Visit: Payer: Self-pay

## 2018-11-11 ENCOUNTER — Encounter: Payer: Self-pay | Admitting: Family Medicine

## 2018-11-11 ENCOUNTER — Ambulatory Visit (INDEPENDENT_AMBULATORY_CARE_PROVIDER_SITE_OTHER): Payer: Medicare Other | Admitting: Family Medicine

## 2018-11-11 ENCOUNTER — Ambulatory Visit: Payer: Medicare Other | Admitting: Family Medicine

## 2018-11-11 DIAGNOSIS — H9201 Otalgia, right ear: Secondary | ICD-10-CM | POA: Diagnosis not present

## 2018-11-11 DIAGNOSIS — J3089 Other allergic rhinitis: Secondary | ICD-10-CM

## 2018-11-11 DIAGNOSIS — J309 Allergic rhinitis, unspecified: Secondary | ICD-10-CM | POA: Insufficient documentation

## 2018-11-11 NOTE — Progress Notes (Signed)
Chief Complaint  Patient presents with  . Ear Pain    Right    History of Present Illness: Otalgia  There is pain in the right ear. This is a new problem. The current episode started in the past 7 days ( 6 days ago after had fan on at night.). The problem has been waxing and waning. There has been no fever. The pain is at a severity of 5/10. The pain is moderate. Pertinent negatives include no coughing, diarrhea, headaches, hearing loss or rhinorrhea. Associated symptoms comments:  occ congestion from allergies  mild anterior face pain. She has tried acetaminophen (loratidine) for the symptoms. The treatment provided no relief. There is no history of a chronic ear infection, hearing loss or a tympanostomy tube.     COVID 19 screen No recent travel or known exposure to COVID19 The patient denies respiratory symptoms of COVID 19 at this time.  The importance of social distancing was discussed today.   Review of Systems  HENT: Positive for ear pain. Negative for hearing loss and rhinorrhea.   Respiratory: Negative for cough.   Gastrointestinal: Negative for diarrhea.  Neurological: Negative for headaches.      Past Medical History:  Diagnosis Date  . Arthritis   . 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 Diverticulosis    per colonoscopy  . Heart murmur   . Hyperlipidemia   . Hypertension     reports that she quit smoking about 44 years ago. Her smoking use included cigarettes. She has never used smokeless tobacco. She reports current alcohol use of about 1.0 standard drinks of alcohol per week. She reports that she does not use drugs.   Current Outpatient Medications:  .  amLODipine (NORVASC) 5 MG tablet, TAKE 1 TABLET BY MOUTH  DAILY, Disp: 90 tablet, Rfl: 3 .  aspirin 81 MG tablet, Take 81 mg by mouth daily., Disp: , Rfl:  .  Calcium Carbonate-Vitamin D (CALCIUM-VITAMIN D3 PO), Take 1,000 mg by mouth daily., Disp: , Rfl:  .  Cholecalciferol (VITAMIN D3) 10000 units TABS, Take  by mouth., Disp: , Rfl:  .  irbesartan (AVAPRO) 150 MG tablet, TAKE 1 TABLET BY MOUTH ONCE DAILY FOR BLOOD PRESSURE. (REPLACES LOSARTAN), Disp: 90 tablet, Rfl: 3 .  loratadine (CLARITIN) 10 MG tablet, Take 10 mg by mouth daily., Disp: , Rfl:  .  lovastatin (MEVACOR) 20 MG tablet, TAKE 1 TABLET BY MOUTH AT  BEDTIME, Disp: 90 tablet, Rfl: 3 .  Omega-3 Fatty Acids (FISH OIL) 1000 MG CAPS, Take 1 capsule by mouth daily., Disp: , Rfl:    Observations/Objective: Blood pressure 104/70, pulse 65, temperature 98.7 F (37.1 C), temperature source Temporal, height 5' 6.25" (1.683 m), weight 150 lb 8 oz (68.3 kg), SpO2 96 %.  Physical Exam Constitutional:      General: She is not in acute distress.    Appearance: She is well-developed. She is not ill-appearing or toxic-appearing.  HENT:     Head: Normocephalic.     Right Ear: Hearing, ear canal and external ear normal. A middle ear effusion is present. Tympanic membrane is not erythematous, retracted or bulging.     Left Ear: Hearing, tympanic membrane, ear canal and external ear normal. There is impacted cerumen. Tympanic membrane is not erythematous, retracted or bulging.     Nose: Mucosal edema present. No rhinorrhea.     Right Sinus: No maxillary sinus tenderness or frontal sinus tenderness.     Left Sinus: No maxillary sinus  tenderness or frontal sinus tenderness.     Mouth/Throat:     Pharynx: Uvula midline.  Eyes:     General: Lids are normal. Lids are everted, no foreign bodies appreciated.     Conjunctiva/sclera: Conjunctivae normal.     Pupils: Pupils are equal, round, and reactive to light.  Neck:     Musculoskeletal: Normal range of motion and neck supple.     Thyroid: No thyroid mass or thyromegaly.     Vascular: No carotid bruit.     Trachea: Trachea normal.  Cardiovascular:     Rate and Rhythm: Normal rate and regular rhythm.     Pulses: Normal pulses.     Heart sounds: Normal heart sounds, S1 normal and S2 normal. No murmur. No  friction rub. No gallop.   Pulmonary:     Effort: Pulmonary effort is normal. No tachypnea or respiratory distress.     Breath sounds: Normal breath sounds. No decreased breath sounds, wheezing, rhonchi or rales.  Lymphadenopathy:     Head:     Right side of head: No submental, submandibular, tonsillar, preauricular, posterior auricular or occipital adenopathy.     Left side of head: No submental, submandibular, tonsillar, preauricular, posterior auricular or occipital adenopathy.     Cervical: No cervical adenopathy.     Upper Body:     Right upper body: No supraclavicular adenopathy.     Left upper body: No supraclavicular adenopathy.  Skin:    General: Skin is warm and dry.     Findings: No rash.  Neurological:     Mental Status: She is alert.  Psychiatric:        Mood and Affect: Mood is not anxious or depressed.        Speech: Speech normal.        Behavior: Behavior normal. Behavior is cooperative.        Judgment: Judgment normal.       irrigatted left ear without complication. Assessment and Plan   Right ear pain Likely due to ETD. Treat with  Antihistamine, nasal saline and nasal steroid spray.      Eliezer Lofts, MD

## 2018-11-11 NOTE — Patient Instructions (Signed)
Stop claritin.  Start OTC zyrtec at bedtime.  Start flonase 2 sprays per nostril daily.  Can use nasal saline spray 2-3 times daily.

## 2018-11-11 NOTE — Assessment & Plan Note (Signed)
Likely due to ETD. Treat with  Antihistamine, nasal saline and nasal steroid spray.

## 2018-12-03 ENCOUNTER — Other Ambulatory Visit: Payer: Self-pay

## 2018-12-03 DIAGNOSIS — I6523 Occlusion and stenosis of bilateral carotid arteries: Secondary | ICD-10-CM

## 2018-12-05 ENCOUNTER — Ambulatory Visit (INDEPENDENT_AMBULATORY_CARE_PROVIDER_SITE_OTHER): Payer: Medicare Other | Admitting: Family

## 2018-12-05 ENCOUNTER — Encounter: Payer: Self-pay | Admitting: Family

## 2018-12-05 ENCOUNTER — Ambulatory Visit (HOSPITAL_COMMUNITY)
Admission: RE | Admit: 2018-12-05 | Discharge: 2018-12-05 | Disposition: A | Discharge status: Home / Self Care | Payer: Medicare Other | Source: Ambulatory Visit | Attending: Family | Admitting: Family

## 2018-12-05 ENCOUNTER — Other Ambulatory Visit: Payer: Self-pay

## 2018-12-05 VITALS — BP 135/70 | HR 61 | Temp 97.1°F | Resp 16 | Ht 67.0 in | Wt 148.0 lb

## 2018-12-05 DIAGNOSIS — I6523 Occlusion and stenosis of bilateral carotid arteries: Secondary | ICD-10-CM

## 2018-12-05 NOTE — Patient Instructions (Signed)

## 2018-12-05 NOTE — Progress Notes (Signed)
Chief Complaint: Follow up Extracranial Carotid Artery Stenosis   History of Present Illness  Rachel Bowers is a 78 y.o. female who was initially referred to Dr. Trula Slade for evaluation of left carotid stenosis. Several months prior to her initial evaluation the patient felt a lump in the right side of her neck. During her workup she had a CT scan which showed left carotid calcification. This led to a carotid duplex. She was found to have velocity elevations consistent with a 50-69% left carotid stenosis. There was only trace plaque without stenosis in the right carotid artery.  She denies any history of TIA or stroke symptoms, specifically she denies a history of amaurosis fugax or monocular blindness, unilateral facial drooping, hemiplegia, or receptive or expressive aphasia.   She does have a history of smoking but quit in the 1970s. There is a family history of premature cardiac death in her mother who died at age 39 of a massive heart attack.  She stopped Fosamax in June 2016 due to right jaw pain and bilateral leg pain, both have mostly resolved since she stopped the Fosamax. 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  She mows and edges her yard, cuts tree limbs. She denies claudication type symptoms with walking.    Diabetic: no Tobacco use: former smoker, quit in the 1970's, smoked less than a year  Pt meds include: Statin : yes ASA: yes Other anticoagulants/antiplatelets: no   Past Medical History:  Diagnosis Date  . Arthritis   . 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 Diverticulosis    per colonoscopy  . Heart murmur   . Hyperlipidemia   . Hypertension     Social History Social History   Tobacco Use  . Smoking status: Former Smoker    Types: Cigarettes    Quit date: 01/25/1974    Years since quitting: 44.8  . Smokeless tobacco: Never Used  Substance Use Topics  . Alcohol use:  Yes    Alcohol/week: 1.0 standard drinks    Types: 1 Glasses of wine per week  . Drug use: No    Family History Family History  Problem Relation Age of Onset  . Heart disease Mother   . Hyperlipidemia Mother   . Hypertension Mother   . Heart attack Mother   . Lung cancer Brother     Surgical History Past Surgical History:  Procedure Laterality Date  . ABDOMINAL HYSTERECTOMY  1982  . APPENDECTOMY  2008    Allergies  Allergen Reactions  . Penicillins Hives    Current Outpatient Medications  Medication Sig Dispense Refill  . amLODipine (NORVASC) 5 MG tablet TAKE 1 TABLET BY MOUTH  DAILY 90 tablet 3  . aspirin 81 MG tablet Take 81 mg by mouth daily.    . Calcium Carbonate-Vitamin D (CALCIUM-VITAMIN D3 PO) Take 1,000 mg by mouth daily.    . Cholecalciferol (VITAMIN D3) 10000 units TABS Take by mouth.    . irbesartan (AVAPRO) 150 MG tablet TAKE 1 TABLET BY MOUTH ONCE DAILY FOR BLOOD PRESSURE. (REPLACES LOSARTAN) 90 tablet 3  . loratadine (CLARITIN) 10 MG tablet Take 10 mg by mouth daily.    Marland Kitchen lovastatin (MEVACOR) 20 MG tablet TAKE 1 TABLET BY MOUTH AT  BEDTIME 90 tablet 3  . Omega-3 Fatty Acids (FISH OIL) 1000 MG CAPS Take 1 capsule by mouth daily.     No current facility-administered medications  for this visit.     Review of Systems : See HPI for pertinent positives and negatives.  Physical Examination  Vitals:   12/05/18 0845 12/05/18 0850  BP: 135/76 135/70  Pulse: 61 61  Resp: 16   Temp: (!) 97.1 F (36.2 C)   TempSrc: Temporal   SpO2: 96%   Weight: 148 lb (67.1 kg)   Height: 5\' 7"  (1.702 m)    Body mass index is 23.18 kg/m.  General: WDWN female in NAD GAIT: normal Eyes: PERRLA HENT: No gross abnormalities.  Pulmonary:  Respirations are non-labored, good air movement in all fields, CTAB, no rales, rhonchi, or wheezes. Cardiac: regular rhythm, no detected murmur.  VASCULAR EXAM Carotid Bruits Right Left   Negative Negative     Abdominal aortic  pulse is no palpable. Radial pulses are 2+ palpable and equal.                                                                                                                            LE Pulses Right Left       POPLITEAL  not palpable   not palpable       POSTERIOR TIBIAL   palpable    palpable        DORSALIS PEDIS      ANTERIOR TIBIAL  palpable   palpable     Gastrointestinal: soft, nontender, BS WNL, no r/g, no palpable masses. Musculoskeletal: no muscle atrophy/wasting. M/S 5/5 throughout, extremities without ischemic changes Skin: No rashes, no ulcers, no cellulitis.   Neurologic:  A&O X 3; appropriate affect, sensation is normal; speech is normal, CN 2-12 intact, pain and light touch intact in extremities, motor exam as listed above. Psychiatric: Normal thought content, mood appropriate to clinical situation.    Assessment: Rachel Bowers is a 78 y.o. female who has no history of stroke or TIA. Fortunately she does not have DM, smoked for less than a year in the 27's, has a normal BMI, and stays physically active.  She takes a daily statin and ASA.    DATA Carotid Duplex (12-05-18): Right ICA: 1-39% stenosis. Left ICA: 40-59% stenosis. Bilateral vertebral artery flow is antegrade. Bilateral subclavian artery waveforms are multiphasic. No change compared to the last exam on 02-13-17.   Plan: Follow-up in 1 year with Carotid Duplex scan.   I discussed in depth with the patient the nature of atherosclerosis, and emphasized the importance of maximal medical management including strict control of blood pressure, blood glucose, and lipid levels, obtaining regular exercise, and continued cessation of smoking.  The patient is aware that without maximal medical management the underlying atherosclerotic disease process will progress, limiting the benefit of any interventions. The patient was given information about stroke prevention and what symptoms should prompt the patient  to seek immediate medical care. Thank you for allowing [place] to participate in this patient's care.  Rachel Level Minah Axelrod, RN, MSN, FNP-C [facility]:  [phone]  Clinic Physician: Donzetta Matters  12/05/18 8:52 AM

## 2018-12-19 ENCOUNTER — Ambulatory Visit (INDEPENDENT_AMBULATORY_CARE_PROVIDER_SITE_OTHER): Payer: Medicare Other

## 2018-12-19 DIAGNOSIS — Z23 Encounter for immunization: Secondary | ICD-10-CM

## 2019-03-27 ENCOUNTER — Telehealth: Payer: Self-pay

## 2019-03-27 ENCOUNTER — Other Ambulatory Visit: Payer: Self-pay | Admitting: Primary Care

## 2019-03-27 DIAGNOSIS — E785 Hyperlipidemia, unspecified: Secondary | ICD-10-CM

## 2019-03-27 DIAGNOSIS — E559 Vitamin D deficiency, unspecified: Secondary | ICD-10-CM

## 2019-03-27 DIAGNOSIS — I10 Essential (primary) hypertension: Secondary | ICD-10-CM

## 2019-03-27 DIAGNOSIS — Z1231 Encounter for screening mammogram for malignant neoplasm of breast: Secondary | ICD-10-CM | POA: Diagnosis not present

## 2019-03-27 DIAGNOSIS — R739 Hyperglycemia, unspecified: Secondary | ICD-10-CM

## 2019-03-27 LAB — HM MAMMOGRAPHY

## 2019-03-27 NOTE — Telephone Encounter (Signed)
LVM w COVID screen, front door and back lab 12.18.2020 TLJ

## 2019-03-30 DIAGNOSIS — D3132 Benign neoplasm of left choroid: Secondary | ICD-10-CM | POA: Diagnosis not present

## 2019-03-30 DIAGNOSIS — H04123 Dry eye syndrome of bilateral lacrimal glands: Secondary | ICD-10-CM | POA: Diagnosis not present

## 2019-03-30 DIAGNOSIS — H0102A Squamous blepharitis right eye, upper and lower eyelids: Secondary | ICD-10-CM | POA: Diagnosis not present

## 2019-03-30 DIAGNOSIS — H35372 Puckering of macula, left eye: Secondary | ICD-10-CM | POA: Diagnosis not present

## 2019-03-30 DIAGNOSIS — H2513 Age-related nuclear cataract, bilateral: Secondary | ICD-10-CM | POA: Diagnosis not present

## 2019-03-31 ENCOUNTER — Encounter: Payer: Self-pay | Admitting: Primary Care

## 2019-03-31 ENCOUNTER — Other Ambulatory Visit (INDEPENDENT_AMBULATORY_CARE_PROVIDER_SITE_OTHER): Payer: Medicare Other

## 2019-03-31 ENCOUNTER — Other Ambulatory Visit: Payer: Self-pay

## 2019-03-31 DIAGNOSIS — E785 Hyperlipidemia, unspecified: Secondary | ICD-10-CM | POA: Diagnosis not present

## 2019-03-31 DIAGNOSIS — R739 Hyperglycemia, unspecified: Secondary | ICD-10-CM

## 2019-03-31 DIAGNOSIS — I10 Essential (primary) hypertension: Secondary | ICD-10-CM

## 2019-03-31 DIAGNOSIS — E559 Vitamin D deficiency, unspecified: Secondary | ICD-10-CM

## 2019-03-31 LAB — LIPID PANEL
Cholesterol: 184 mg/dL (ref 0–200)
HDL: 63 mg/dL (ref >39.00)
LDL Cholesterol: 96 mg/dL (ref 0–99)
NonHDL: 121.05
Total CHOL/HDL Ratio: 3
Triglycerides: 124 mg/dL (ref 0.0–149.0)
VLDL: 24.8 mg/dL (ref 0.0–40.0)

## 2019-03-31 LAB — CBC
HCT: 39 % (ref 36.0–46.0)
Hemoglobin: 12.8 g/dL (ref 12.0–15.0)
MCHC: 32.9 g/dL (ref 30.0–36.0)
MCV: 92.2 fl (ref 78.0–100.0)
Platelets: 297 10*3/uL (ref 150.0–400.0)
RBC: 4.23 Mil/uL (ref 3.87–5.11)
RDW: 13.4 % (ref 11.5–15.5)
WBC: 6 10*3/uL (ref 4.0–10.5)

## 2019-03-31 LAB — COMPREHENSIVE METABOLIC PANEL
ALT: 19 U/L (ref 0–35)
AST: 16 U/L (ref 0–37)
Albumin: 4.4 g/dL (ref 3.5–5.2)
Alkaline Phosphatase: 54 U/L (ref 39–117)
BUN: 16 mg/dL (ref 6–23)
CO2: 30 mEq/L (ref 19–32)
Calcium: 9.8 mg/dL (ref 8.4–10.5)
Chloride: 103 mEq/L (ref 96–112)
Creatinine, Ser: 0.84 mg/dL (ref 0.40–1.20)
GFR: 65.53 mL/min (ref >60.00)
Glucose, Bld: 101 mg/dL — ABNORMAL HIGH (ref 70–99)
Potassium: 4 mEq/L (ref 3.5–5.1)
Sodium: 139 mEq/L (ref 135–145)
Total Bilirubin: 0.5 mg/dL (ref 0.2–1.2)
Total Protein: 7 g/dL (ref 6.0–8.3)

## 2019-03-31 LAB — VITAMIN D 25 HYDROXY (VIT D DEFICIENCY, FRACTURES): VITD: 99.72 ng/mL (ref 30.00–100.00)

## 2019-03-31 LAB — HEMOGLOBIN A1C: Hgb A1c MFr Bld: 6 % (ref 4.6–6.5)

## 2019-04-06 ENCOUNTER — Other Ambulatory Visit: Payer: Self-pay | Admitting: Primary Care

## 2019-04-06 DIAGNOSIS — I10 Essential (primary) hypertension: Secondary | ICD-10-CM

## 2019-04-07 ENCOUNTER — Ambulatory Visit (INDEPENDENT_AMBULATORY_CARE_PROVIDER_SITE_OTHER): Payer: Medicare Other

## 2019-04-07 ENCOUNTER — Ambulatory Visit: Payer: Medicare Other

## 2019-04-07 ENCOUNTER — Other Ambulatory Visit: Payer: Self-pay

## 2019-04-07 ENCOUNTER — Encounter: Payer: Medicare Other | Admitting: Primary Care

## 2019-04-07 VITALS — BP 132/69 | HR 77 | Wt 148.0 lb

## 2019-04-07 DIAGNOSIS — Z0000 Encounter for general adult medical examination without abnormal findings: Secondary | ICD-10-CM | POA: Diagnosis not present

## 2019-04-07 NOTE — Progress Notes (Signed)
Subjective:   Rachel Bowers is a 78 y.o. female who presents for Medicare Annual (Subsequent) preventive examination.  Review of Systems: N/A   This visit is being conducted through telemedicine via telephone at the nurse health advisor's home address due to the COVID-19 pandemic. This patient has given me verbal consent via doximity to conduct this visit, patient states they are participating from their home address. Patient and myself are on the telephone call. There is no referral for this visit. Some vital signs may be absent or patient reported.    Patient identification: identified by name, DOB, and current address   Cardiac Risk Factors include: advanced age (>16men, >41 women);hypertension;dyslipidemia     Objective:     Vitals: BP 132/69   Pulse 77   Wt 148 lb (67.1 kg)   BMI 23.18 kg/m   Body mass index is 23.18 kg/m.  Advanced Directives 04/07/2019 03/28/2018 03/27/2017 01/31/2015 01/25/2014  Does Patient Have a Medical Advance Directive? Yes Yes Yes Yes Yes  Type of Paramedic of [place];Living will [place];Living will [place];Living will [place];Living will [place];Living will  Does patient want to make changes to medical advance directive? - - - No - Patient declined -  Copy of [place] in Chart? No - copy requested No - copy requested No - copy requested No - copy requested No - copy requested    Tobacco Social History   Tobacco Use  Smoking Status Former Smoker  . Types: Cigarettes  . Quit date: 01/25/1974  . Years since quitting: 45.2  Smokeless Tobacco Never Used     Counseling given: Not Answered   Clinical Intake:  Pre-visit preparation completed: Yes  Pain : 0-10 Pain Score: 10-Worst pain ever Pain Type: Chronic pain Pain Location: Hand Pain Orientation: Left, Right Pain Descriptors / Indicators: Aching Pain  Onset: More than a month ago Pain Frequency: Intermittent     Nutritional Risks: None Diabetes: No  How often do you need to have someone help you when you read instructions, pamphlets, or other written materials from your doctor or pharmacy?: 1 - Never What is the last grade level you completed in school?: 12th  Interpreter Needed?: No  Information entered by :: CJohnson, LPN  Past Medical History:  Diagnosis Date  . Arthritis   . 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 Diverticulosis    per colonoscopy  . Heart murmur   . Hyperlipidemia   . Hypertension    Past Surgical History:  Procedure Laterality Date  . ABDOMINAL HYSTERECTOMY  1982  . APPENDECTOMY  2008   Family History  Problem Relation Age of Onset  . Heart disease Mother   . Hyperlipidemia Mother   . Hypertension Mother   . Heart attack Mother   . Lung cancer Brother    Social History   Socioeconomic History  . Marital status: Married    Spouse name: Not on file  . Number of children: Not on file  . Years of education: Not on file  . Highest education level: Not on file  Occupational History  . Occupation: retired  Tobacco Use  . Smoking status: Former Smoker    Types: Cigarettes    Quit date: 01/25/1974    Years since quitting: 45.2  . Smokeless tobacco: Never Used  Substance and Sexual Activity  . Alcohol use: Yes    Alcohol/week: 1.0 standard drinks  Types: 1 Glasses of wine per week  . Drug use: No  . Sexual activity: Not on file  Other Topics Concern  . Not on file  Social History Narrative   Married.   1 daughter and 1 son. 2 grandchildren.   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   Enjoys exercising at the [facility], reading.    Social Determinants of Health   Financial Resource Strain: Low Risk   . Difficulty of Paying Living Expenses: Not hard at all  Food Insecurity: No Food Insecurity  . Worried About Charity fundraiser in the Last  Year: Never true  . Ran Out of Food in the Last Year: Never true  Transportation Needs: No Transportation Needs  . Lack of Transportation (Medical): No  . Lack of Transportation (Non-Medical): No  Physical Activity: Sufficiently Active  . Days of Exercise per Week: 7 days  . Minutes of Exercise per Session: 30 min  Stress: No Stress Concern Present  . Feeling of Stress : Not at all  Social Connections:   . Frequency of Communication with Friends and Family: Not on file  . Frequency of Social Gatherings with Friends and Family: Not on file  . Attends Religious Services: Not on file  . Active Member of Clubs or Organizations: Not on file  . Attends Archivist Meetings: Not on file  . Marital Status: Not on file    Outpatient Encounter Medications as of 04/07/2019  Medication Sig  . amLODipine (NORVASC) 5 MG tablet TAKE 1 TABLET BY MOUTH  DAILY  . aspirin 81 MG tablet Take 81 mg by mouth daily.  . Calcium Carbonate-Vitamin D (CALCIUM-VITAMIN D3 PO) Take 1,000 mg by mouth daily.  . Cholecalciferol (VITAMIN D3) 10000 units TABS Take by mouth.  . irbesartan (AVAPRO) 150 MG tablet TAKE 1 TABLET BY MOUTH ONCE DAILY FOR BLOOD PRESSURE. (REPLACES LOSARTAN)  . loratadine (CLARITIN) 10 MG tablet Take 10 mg by mouth daily.  Marland Kitchen lovastatin (MEVACOR) 20 MG tablet TAKE 1 TABLET BY MOUTH AT  BEDTIME  . Omega-3 Fatty Acids (FISH OIL) 1000 MG CAPS Take 1 capsule by mouth daily.   No facility-administered encounter medications on file as of 04/07/2019.    Activities of Daily Living In your present state of health, do you have any difficulty performing the following activities: 04/07/2019  Hearing? N  Vision? N  Difficulty concentrating or making decisions? N  Walking or climbing stairs? N  Dressing or bathing? N  Doing errands, shopping? N  Preparing Food and eating ? N  Using the Toilet? N  In the past six months, have you accidently leaked urine? Y  Comment wears pad always  Do you  have problems with loss of bowel control? N  Managing your Medications? N  Managing your Finances? N  Housekeeping or managing your Housekeeping? N  Some recent data might be hidden    Patient Care Team: Pleas Koch, NP as PCP - General (Internal Medicine)    Assessment:   This is a routine wellness examination for Rachel Bowers.  Exercise Activities and Dietary recommendations Current Exercise Habits: Home exercise routine, Type of exercise: walking, Time (Minutes): 30, Frequency (Times/Week): 7, Weekly Exercise (Minutes/Week): 210, Intensity: Mild, Exercise limited by: None identified  Goals    . Increase physical activity     Starting 03/28/2018, I will continue to walk for 30 minutes 5 days per week.     . Patient  Stated     04/07/2019, I will maintain and continue medications as prescribed.        Fall Risk Fall Risk  04/07/2019 03/28/2018 03/27/2017 03/21/2016  Falls in the past year? 0 1 No No  Comment - loss balance and fell on buttocks - -  Number falls in past yr: 0 0 - -  Injury with Fall? 0 1 - -  Comment - lacerations to hands - -  Risk for fall due to : Medication side effect - - -  Follow up Falls evaluation completed;Falls prevention discussed - - -   Is the patient's home free of loose throw rugs in walkways, pet beds, electrical cords, etc?   yes      Grab bars in the bathroom? yes      Handrails on the stairs?   yes      Adequate lighting?   yes  Timed Get Up and Go performed: N/A  Depression Screen PHQ 2/9 Scores 04/07/2019 03/28/2018 03/27/2017 03/21/2016  PHQ - 2 Score 0 0 2 1  PHQ- 9 Score 0 0 5 -     Cognitive Function MMSE - Mini Mental State Exam 04/07/2019 03/28/2018 03/27/2017  Orientation to time 5 5 5   Orientation to Place 5 5 5   Registration 3 3 3   Attention/ Calculation 5 0 0  Recall 3 3 3   Language- name 2 objects - 0 0  Language- repeat 1 1 1   Language- follow 3 step command - 3 3  Language- read & follow direction - 0 0    Write a sentence - 0 0  Copy design - 0 0  Total score - 20 20  Mini Cog  Mini-Cog screen was completed. Maximum score is 22. A value of 0 denotes this part of the MMSE was not completed or the patient failed this part of the Mini-Cog screening.       Immunization History  Administered Date(s) Administered  . Fluad Quad(high Dose 65+) 12/19/2018  . Influenza,inj,Quad PF,6+ Mos 12/30/2015, 01/11/2017, 01/09/2018  . Influenza-Unspecified 12/24/2014  . Pneumococcal Conjugate-13 02/22/2014  . Pneumococcal Polysaccharide-23 01/14/2006, 02/06/2008  . Tdap 02/17/2013  . Zoster 02/12/2011    Qualifies for Shingles Vaccine? Yes  Screening Tests Health Maintenance  Topic Date Due  . TETANUS/TDAP  02/18/2023  . INFLUENZA VACCINE  Completed  . DEXA SCAN  Completed  . PNA vac Low Risk Adult  Completed    Cancer Screenings: Lung: Low Dose CT Chest recommended if Age 64-80 years, 30 pack-year currently smoking OR have quit w/in 15years. Patient does not qualify. Breast:  Up to date on Mammogram? Yes, completed 03/27/2019   Up to date of Bone Density/Dexa? Yes, completed 04/16/2017 Colorectal: completed 04/19/2014  Additional Screenings:  Hepatitis C Screening: N/A     Plan:   Patient will maintain and continue medications as prescribed.    I have personally reviewed and noted the following in the patient's chart:   . Medical and social history . Use of alcohol, tobacco or illicit drugs  . Current medications and supplements . Functional ability and status . Nutritional status . Physical activity . Advanced directives . List of other physicians . Hospitalizations, surgeries, and ER visits in previous 12 months . Vitals . Screenings to include cognitive, depression, and falls . Referrals and appointments  In addition, I have reviewed and discussed with patient certain preventive protocols, quality metrics, and best practice recommendations. A written personalized care plan  for preventive services as well  as general preventive health recommendations were provided to patient.     Andrez Grime, LPN  [id number]

## 2019-04-07 NOTE — Patient Instructions (Signed)
[name] , Thank you for taking time to come for your Medicare Wellness Visit. I appreciate your ongoing commitment to your health goals. Please review the following plan we discussed and let me know if I can assist you in the future.   Screening recommendations/referrals: Colonoscopy: Up to date, completed 04/19/2014 Mammogram: Up to date, completed 03/27/2019 Bone Density: Up to date, completed 04/16/2017 Recommended yearly ophthalmology/optometry visit for glaucoma screening and checkup Recommended yearly dental visit for hygiene and checkup  Vaccinations: Influenza vaccine: Up to date, completed 12/19/2018 Pneumococcal vaccine: Completed series Tdap vaccine: Up to date, completed 02/17/2013 Shingles vaccine: discussed    Advanced directives: Please bring a copy of your POA (Power of [place]) and/or Living Will to your next appointment.   Conditions/risks identified: hypertension, hyperlipidemia  Next appointment: 04/09/2019 @ 8 am    Preventive Care 65 Years and Older, Female Preventive care refers to lifestyle choices and visits with your health care provider that can promote health and wellness. What does preventive care include?  A yearly physical exam. This is also called an annual well check.  Dental exams once or twice a year.  Routine eye exams. Ask your health care provider how often you should have your eyes checked.  Personal lifestyle choices, including:  Daily care of your teeth and gums.  Regular physical activity.  Eating a healthy diet.  Avoiding tobacco and drug use.  Limiting alcohol use.  Practicing safe sex.  Taking low-dose aspirin every day.  Taking vitamin and mineral supplements as recommended by your health care provider. What happens during an annual well check? The services and screenings done by your health care provider during your annual well check will depend on your age, overall health, lifestyle risk factors, and family history of  disease. Counseling  Your health care provider may ask you questions about your:  Alcohol use.  Tobacco use.  Drug use.  Emotional well-being.  Home and relationship well-being.  Sexual activity.  Eating habits.  History of falls.  Memory and ability to understand (cognition).  Work and work Statistician.  Reproductive health. Screening  You may have the following tests or measurements:  Height, weight, and BMI.  Blood pressure.  Lipid and cholesterol levels. These may be checked every 5 years, or more frequently if you are over 36 years old.  Skin check.  Lung cancer screening. You may have this screening every year starting at age 4 if you have a 30-pack-year history of smoking and currently smoke or have quit within the past 15 years.  Fecal occult blood test (FOBT) of the stool. You may have this test every year starting at age 66.  Flexible sigmoidoscopy or colonoscopy. You may have a sigmoidoscopy every 5 years or a colonoscopy every 10 years starting at age 2.  Hepatitis C blood test.  Hepatitis B blood test.  Sexually transmitted disease (STD) testing.  Diabetes screening. This is done by checking your blood sugar (glucose) after you have not eaten for a while (fasting). You may have this done every 1-3 years.  Bone density scan. This is done to screen for osteoporosis. You may have this done starting at age 55.  Mammogram. This may be done every 1-2 years. Talk to your health care provider about how often you should have regular mammograms. Talk with your health care provider about your test results, treatment options, and if necessary, the need for more tests. Vaccines  Your health care provider may recommend certain vaccines, such as:  Influenza vaccine. This is recommended every year.  Tetanus, diphtheria, and acellular pertussis (Tdap, Td) vaccine. You may need a Td booster every 10 years.  Zoster vaccine. You may need this after age  7.  Pneumococcal 13-valent conjugate (PCV13) vaccine. One dose is recommended after age 54.  Pneumococcal polysaccharide (PPSV23) vaccine. One dose is recommended after age 41. Talk to your health care provider about which screenings and vaccines you need and how often you need them. This information is not intended to replace advice given to you by your health care provider. Make sure you discuss any questions you have with your health care provider. Document Released: 04/22/2015 Document Revised: 12/14/2015 Document Reviewed: 01/25/2015 Elsevier Interactive Patient Education  2017 [place] Prevention in the Home Falls can cause injuries. They can happen to people of all ages. There are many things you can do to make your home safe and to help prevent falls. What can I do on the outside of my home?  Regularly fix the edges of walkways and driveways and fix any cracks.  Remove anything that might make you trip as you walk through a door, such as a raised step or threshold.  Trim any bushes or trees on the path to your home.  Use bright outdoor lighting.  Clear any walking paths of anything that might make someone trip, such as rocks or tools.  Regularly check to see if handrails are loose or broken. Make sure that both sides of any steps have handrails.  Any raised decks and porches should have guardrails on the edges.  Have any leaves, snow, or ice cleared regularly.  Use sand or salt on walking paths during winter.  Clean up any spills in your garage right away. This includes oil or grease spills. What can I do in the bathroom?  Use night lights.  Install grab bars by the toilet and in the tub and shower. Do not use towel bars as grab bars.  Use non-skid mats or decals in the tub or shower.  If you need to sit down in the shower, use a plastic, non-slip stool.  Keep the floor dry. Clean up any water that spills on the floor as soon as it happens.  Remove  soap buildup in the tub or shower regularly.  Attach bath mats securely with double-sided non-slip rug tape.  Do not have throw rugs and other things on the floor that can make you trip. What can I do in the bedroom?  Use night lights.  Make sure that you have a light by your bed that is easy to reach.  Do not use any sheets or blankets that are too big for your bed. They should not hang down onto the floor.  Have a firm chair that has side arms. You can use this for support while you get dressed.  Do not have throw rugs and other things on the floor that can make you trip. What can I do in the kitchen?  Clean up any spills right away.  Avoid walking on wet floors.  Keep items that you use a lot in easy-to-reach places.  If you need to reach something above you, use a strong step stool that has a grab bar.  Keep electrical cords out of the way.  Do not use floor polish or wax that makes floors slippery. If you must use wax, use non-skid floor wax.  Do not have throw rugs and other things on the floor that  can make you trip. What can I do with my stairs?  Do not leave any items on the stairs.  Make sure that there are handrails on both sides of the stairs and use them. Fix handrails that are broken or loose. Make sure that handrails are as long as the stairways.  Check any carpeting to make sure that it is firmly attached to the stairs. Fix any carpet that is loose or worn.  Avoid having throw rugs at the top or bottom of the stairs. If you do have throw rugs, attach them to the floor with carpet tape.  Make sure that you have a light switch at the top of the stairs and the bottom of the stairs. If you do not have them, ask someone to add them for you. What else can I do to help prevent falls?  Wear shoes that:  Do not have high heels.  Have rubber bottoms.  Are comfortable and fit you well.  Are closed at the toe. Do not wear sandals.  If you use a  stepladder:  Make sure that it is fully opened. Do not climb a closed stepladder.  Make sure that both sides of the stepladder are locked into place.  Ask someone to hold it for you, if possible.  Clearly mark and make sure that you can see:  Any grab bars or handrails.  First and last steps.  Where the edge of each step is.  Use tools that help you move around (mobility aids) if they are needed. These include:  Canes.  Walkers.  Scooters.  Crutches.  Turn on the lights when you go into a dark area. Replace any light bulbs as soon as they burn out.  Set up your furniture so you have a clear path. Avoid moving your furniture around.  If any of your floors are uneven, fix them.  If there are any pets around you, be aware of where they are.  Review your medicines with your doctor. Some medicines can make you feel dizzy. This can increase your chance of falling. Ask your doctor what other things that you can do to help prevent falls. This information is not intended to replace advice given to you by your health care provider. Make sure you discuss any questions you have with your health care provider. Document Released: 01/20/2009 Document Revised: 09/01/2015 Document Reviewed: 04/30/2014 Elsevier Interactive Patient Education  2017 [employer].

## 2019-04-07 NOTE — Progress Notes (Signed)
PCP notes:  Health Maintenance: No gaps noted   Abnormal Screenings: none   Patient concerns: none   Nurse concerns: none   Next PCP appt.: 04/09/2019 @ 8 am

## 2019-04-09 ENCOUNTER — Encounter: Payer: Self-pay | Admitting: Primary Care

## 2019-04-09 ENCOUNTER — Ambulatory Visit (INDEPENDENT_AMBULATORY_CARE_PROVIDER_SITE_OTHER): Payer: Medicare Other | Admitting: Primary Care

## 2019-04-09 ENCOUNTER — Other Ambulatory Visit: Payer: Self-pay

## 2019-04-09 VITALS — BP 108/67 | Wt 147.0 lb

## 2019-04-09 DIAGNOSIS — Z0000 Encounter for general adult medical examination without abnormal findings: Secondary | ICD-10-CM

## 2019-04-09 DIAGNOSIS — E785 Hyperlipidemia, unspecified: Secondary | ICD-10-CM | POA: Diagnosis not present

## 2019-04-09 DIAGNOSIS — I10 Essential (primary) hypertension: Secondary | ICD-10-CM

## 2019-04-09 DIAGNOSIS — M19041 Primary osteoarthritis, right hand: Secondary | ICD-10-CM | POA: Diagnosis not present

## 2019-04-09 DIAGNOSIS — Z23 Encounter for immunization: Secondary | ICD-10-CM | POA: Diagnosis not present

## 2019-04-09 DIAGNOSIS — R7303 Prediabetes: Secondary | ICD-10-CM

## 2019-04-09 DIAGNOSIS — E559 Vitamin D deficiency, unspecified: Secondary | ICD-10-CM

## 2019-04-09 MED ORDER — ZOSTER VAC RECOMB ADJUVANTED 50 MCG/0.5ML IM SUSR: 0.5000 mL | Freq: Once | INTRAMUSCULAR | 1 refills | Status: AC

## 2019-04-09 MED ORDER — IRBESARTAN 150 MG PO TABS: ORAL_TABLET | ORAL | 3 refills | Status: DC

## 2019-04-09 MED ORDER — DICLOFENAC SODIUM 1 % EX GEL: 2.0000 g | Freq: Three times a day (TID) | CUTANEOUS | 0 refills | Status: DC | PRN

## 2019-04-09 MED ORDER — LOVASTATIN 20 MG PO TABS: 20.0000 mg | ORAL_TABLET | Freq: Every day | ORAL | 3 refills | Status: DC

## 2019-04-09 MED ORDER — AMLODIPINE BESYLATE 5 MG PO TABS: 5.0000 mg | ORAL_TABLET | Freq: Every day | ORAL | 3 refills | Status: DC

## 2019-04-09 NOTE — Assessment & Plan Note (Signed)
Recent A1C of 6.0.  Discussed to work on a healthy diet, regular exercise. Continue to monitor.

## 2019-04-09 NOTE — Assessment & Plan Note (Signed)
Chronic to right hand, will trial Rx for diclofenac Gel. She will update.

## 2019-04-09 NOTE — Assessment & Plan Note (Signed)
Compliant to daily dose, recent level stable. Continue same.

## 2019-04-09 NOTE — Patient Instructions (Signed)
  Continue calcium and vitamin D daily for bone strength.   Continue exercising. You should be getting 150 minutes of moderate intensity exercise weekly.  Continue to work on a healthy diet. Ensure you are consuming 64 ounces of water daily.  It was a pleasure to see you today! Allie Bossier, NP-C

## 2019-04-09 NOTE — Assessment & Plan Note (Signed)
Immunizations UTD, Rx for Shingrix sent to pharmacy. Colonoscopy UTD, no repeat needed. Dexa and mammogram UTD. Encouraged regular exercise, healthy diet. Virtual exam unremarkable. Labs reviewed.

## 2019-04-09 NOTE — Assessment & Plan Note (Signed)
Stable with home readings, continue current regimen. Refills sent to pharmacy. CMP reviewed.

## 2019-04-09 NOTE — Progress Notes (Signed)
Subjective:    Patient ID: Rachel Bowers, female    DOB: 10/30/1940, 78 y.o.   MRN: [id number]  HPI  Virtual Visit via Video Note  I connected with Rachel Bowers on 04/09/19 at  8:00 AM EST by a video enabled telemedicine application and verified that I am speaking with the correct person using two identifiers.  Location: Patient: Home Provider: Office   I discussed the limitations of evaluation and management by telemedicine and the availability of in person appointments. The patient expressed understanding and agreed to proceed.  History of Present Illness:  Rachel Bowers is a 78 year old female who presents today for complete physical.  Immunizations: -Tetanus: completed in 2014 -Influenza: completed this season  -Shingles: Completed Zostavax in 2012 -Pneumonia: Completed Prevnar in 2015, Pneumovax in 2009  Diet: She endorses a fair diet.  Exercise: She is walking daily weather permitting.   Eye exam: Completed in 2020 Dental exam: Completed in 2020  Mammogram: Completed in 2020 Dexa: Completed in 2019, taking calcium and vitamin D Colonoscopy: Completed in 2016  BP Readings from Last 3 Encounters:  04/09/19 108/67  04/07/19 132/69  12/05/18 135/70      Observations/Objective:  Alert and oriented. Appears well, not sickly. No distress. Speaking in complete sentences.   Assessment and Plan:  See problem based charting.  Follow Up Instructions:  Continue calcium and vitamin D daily for bone strength.   Continue exercising. You should be getting 150 minutes of moderate intensity exercise weekly.  Continue to work on a healthy diet. Ensure you are consuming 64 ounces of water daily.  It was a pleasure to see you today! Allie Bossier, NP-C    I discussed the assessment and treatment plan with the patient. The patient was provided an opportunity to ask questions and all were answered. The patient agreed with the plan and demonstrated an understanding of the  instructions.   The patient was advised to call back or seek an in-person evaluation if the symptoms worsen or if the condition fails to improve as anticipated.   Pleas Koch, NP    Review of Systems  Constitutional: Negative for unexpected weight change.  HENT: Negative for rhinorrhea.   Respiratory: Negative for cough and shortness of breath.   Cardiovascular: Negative for chest pain.  Gastrointestinal: Negative for constipation and diarrhea.  Genitourinary: Negative for difficulty urinating.  Musculoskeletal: Positive for arthralgias.  Skin: Negative for rash.  Allergic/Immunologic: Negative for environmental allergies.  Neurological: Negative for dizziness, numbness and headaches.  Psychiatric/Behavioral: The patient is nervous/anxious.        Past Medical History:  Diagnosis Date  . Arthritis   . 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 Diverticulosis    per colonoscopy  . Heart murmur   . Hyperlipidemia   . Hypertension      Social History   Socioeconomic History  . Marital status: Married    Spouse name: Not on file  . Number of children: Not on file  . Years of education: Not on file  . Highest education level: Not on file  Occupational History  . Occupation: retired  Tobacco Use  . Smoking status: Former Smoker    Types: Cigarettes    Quit date: 01/25/1974    Years since quitting: 45.2  . Smokeless tobacco: Never Used  Substance and Sexual Activity  . Alcohol use: Yes    Alcohol/week: 1.0 standard drinks    Types: 1 Glasses of wine per  week  . Drug use: No  . Sexual activity: Not on file  Other Topics Concern  . Not on file  Social History Narrative   Married.   1 daughter and 1 son. 2 grandchildren.   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   Enjoys exercising at the [facility], reading.    Social Determinants of Health   Financial Resource Strain: Low Risk   . Difficulty of Paying Living Expenses:  Not hard at all  Food Insecurity: No Food Insecurity  . Worried About Charity fundraiser in the Last Year: Never true  . Ran Out of Food in the Last Year: Never true  Transportation Needs: No Transportation Needs  . Lack of Transportation (Medical): No  . Lack of Transportation (Non-Medical): No  Physical Activity: Sufficiently Active  . Days of Exercise per Week: 7 days  . Minutes of Exercise per Session: 30 min  Stress: No Stress Concern Present  . Feeling of Stress : Not at all  Social Connections:   . Frequency of Communication with Friends and Family: Not on file  . Frequency of Social Gatherings with Friends and Family: Not on file  . Attends Religious Services: Not on file  . Active Member of Clubs or Organizations: Not on file  . Attends Archivist Meetings: Not on file  . Marital Status: Not on file  Intimate Partner Violence: Not At Risk  . Fear of Current or Ex-Partner: No  . Emotionally Abused: No  . Physically Abused: No  . Sexually Abused: No    Past Surgical History:  Procedure Laterality Date  . ABDOMINAL HYSTERECTOMY  1982  . APPENDECTOMY  2008    Family History  Problem Relation Age of Onset  . Heart disease Mother   . Hyperlipidemia Mother   . Hypertension Mother   . Heart attack Mother   . Lung cancer Brother     Allergies  Allergen Reactions  . Penicillins Hives    Current Outpatient Medications on File Prior to Visit  Medication Sig Dispense Refill  . amLODipine (NORVASC) 5 MG tablet TAKE 1 TABLET BY MOUTH  DAILY 90 tablet 0  . aspirin 81 MG tablet Take 81 mg by mouth daily.    . Calcium Carbonate-Vitamin D (CALCIUM-VITAMIN D3 PO) Take 1,000 mg by mouth daily.    . Cholecalciferol (VITAMIN D3) 10000 units TABS Take by mouth.    . irbesartan (AVAPRO) 150 MG tablet TAKE 1 TABLET BY MOUTH ONCE DAILY FOR BLOOD PRESSURE. (REPLACES LOSARTAN) 90 tablet 3  . loratadine (CLARITIN) 10 MG tablet Take 10 mg by mouth daily.    Marland Kitchen lovastatin  (MEVACOR) 20 MG tablet TAKE 1 TABLET BY MOUTH AT  BEDTIME 90 tablet 3  . Omega-3 Fatty Acids (FISH OIL) 1000 MG CAPS Take 1 capsule by mouth daily.     No current facility-administered medications on file prior to visit.    BP 108/67   Wt 147 lb (66.7 kg)   BMI 23.02 kg/m    Objective:   Physical Exam  Constitutional: She is oriented to person, place, and time. She appears well-nourished.  HENT:  Head: Normocephalic.  Respiratory: Effort normal.  Musculoskeletal:        General: Normal range of motion.  Neurological: She is alert and oriented to person, place, and time.  Psychiatric: She has a normal mood and affect.           Assessment &  Plan:

## 2019-04-09 NOTE — Assessment & Plan Note (Signed)
Recent LDL at goal. Continue lovastatin.

## 2019-05-06 DIAGNOSIS — E2839 Other primary ovarian failure: Secondary | ICD-10-CM

## 2019-05-19 DIAGNOSIS — M85851 Other specified disorders of bone density and structure, right thigh: Secondary | ICD-10-CM | POA: Diagnosis not present

## 2019-05-19 DIAGNOSIS — M85852 Other specified disorders of bone density and structure, left thigh: Secondary | ICD-10-CM | POA: Diagnosis not present

## 2019-05-19 LAB — HM DEXA SCAN

## 2019-05-27 ENCOUNTER — Encounter: Payer: Self-pay | Admitting: Primary Care

## 2019-08-20 DIAGNOSIS — L821 Other seborrheic keratosis: Secondary | ICD-10-CM | POA: Diagnosis not present

## 2019-08-20 DIAGNOSIS — D225 Melanocytic nevi of trunk: Secondary | ICD-10-CM | POA: Diagnosis not present

## 2019-08-20 DIAGNOSIS — L820 Inflamed seborrheic keratosis: Secondary | ICD-10-CM | POA: Diagnosis not present

## 2019-08-20 DIAGNOSIS — C44519 Basal cell carcinoma of skin of other part of trunk: Secondary | ICD-10-CM | POA: Diagnosis not present

## 2019-08-20 DIAGNOSIS — D485 Neoplasm of uncertain behavior of skin: Secondary | ICD-10-CM | POA: Diagnosis not present

## 2019-08-28 DIAGNOSIS — C44519 Basal cell carcinoma of skin of other part of trunk: Secondary | ICD-10-CM | POA: Diagnosis not present

## 2019-10-07 ENCOUNTER — Ambulatory Visit (INDEPENDENT_AMBULATORY_CARE_PROVIDER_SITE_OTHER)
Admission: RE | Admit: 2019-10-07 | Discharge: 2019-10-07 | Disposition: A | Discharge status: Home / Self Care | Payer: Medicare Other | Source: Ambulatory Visit | Attending: Primary Care | Admitting: Primary Care

## 2019-10-07 ENCOUNTER — Other Ambulatory Visit: Payer: Self-pay

## 2019-10-07 ENCOUNTER — Ambulatory Visit (INDEPENDENT_AMBULATORY_CARE_PROVIDER_SITE_OTHER): Payer: Medicare Other | Admitting: Primary Care

## 2019-10-07 VITALS — BP 116/70 | HR 80 | Temp 96.0°F | Ht 67.0 in | Wt 148.5 lb

## 2019-10-07 DIAGNOSIS — G8929 Other chronic pain: Secondary | ICD-10-CM | POA: Insufficient documentation

## 2019-10-07 DIAGNOSIS — M542 Cervicalgia: Secondary | ICD-10-CM

## 2019-10-07 MED ORDER — DICLOFENAC SODIUM 1 % EX GEL: 2.0000 g | Freq: Three times a day (TID) | CUTANEOUS | 0 refills | Status: AC | PRN

## 2019-10-07 MED ORDER — TIZANIDINE HCL 4 MG PO TABS: 4.0000 mg | ORAL_TABLET | Freq: Every evening | ORAL | 0 refills | Status: DC | PRN

## 2019-10-07 NOTE — Progress Notes (Signed)
Subjective:    Patient ID: Rachel Bowers, female    DOB: Jan 30, 1941, 79 y.o.   MRN: [id number]  HPI  This visit occurred during the SARS-CoV-2 public health emergency.  Safety protocols were in place, including screening questions prior to the visit, additional usage of staff PPE, and extensive cleaning of exam room while observing appropriate contact time as indicated for disinfecting solutions.   [name] is a 79 year old female with a history of hypertension, osteoarthritis, hyperlipidemia, chronic lower back pain, prediabetes who presents today with a chief complaint of neck pain.   Her pain is located to the base of the posterior neck that began about two weeks ago. She describes her pain as throbbing, achy, constant. She has some radiation of pain to the left shoulder.   She denies trauma/injury, numbness/tingling. She's taken Tylenol and Advil with little improvement. Also applying lidocaine patches, and other OTC products without improvement. She has not used her diclofenac gel.   Review of Systems  Musculoskeletal: Positive for arthralgias and neck pain.  Skin: Negative for color change.  Neurological: Negative for numbness.       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%   Diverticulosis    per colonoscopy   Heart murmur    Hyperlipidemia    Hypertension      Social History   Socioeconomic History   Marital status: Married    Spouse name: Not on file   Number of children: Not on file   Years of education: Not on file   Highest education level: Not on file  Occupational History   Occupation: retired  Tobacco Use   Smoking status: Former Smoker    Types: Cigarettes    Quit date: 01/25/1974    Years since quitting: 45.7   Smokeless tobacco: Never Used  Vaping Use   Vaping Use: Never used  Substance and Sexual Activity   Alcohol use: Yes    Alcohol/week: 1.0 standard drink    Types: 1 Glasses  of wine per week   Drug use: No   Sexual activity: Not on file  Other Topics Concern   Not on file  Social History Narrative   Married.   1 daughter and 1 son. 2 grandchildren.   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   Enjoys exercising at the [facility], reading.    Social Determinants of Health   Financial Resource Strain: Low Risk    Difficulty of Paying Living Expenses: Not hard at all  Food Insecurity: No Food Insecurity   Worried About Charity fundraiser in the Last Year: Never true   [place] in the Last Year: Never true  Transportation Needs: No Transportation Needs   Lack of Transportation (Medical): No   Lack of Transportation (Non-Medical): No  Physical Activity: Sufficiently Active   Days of Exercise per Week: 7 days   Minutes of Exercise per Session: 30 min  Stress: No Stress Concern Present   Feeling of Stress : Not at all  Social Connections:    Frequency of Communication with Friends and Family:    Frequency of Social Gatherings with Friends and Family:    Attends Religious Services:    Active Member of Clubs or Organizations:    Attends Archivist Meetings:    Marital Status:   Intimate Partner Violence: Not At Risk  Fear of Current or Ex-Partner: No   Emotionally Abused: No   Physically Abused: No   Sexually Abused: No    Past Surgical History:  Procedure Laterality Date   ABDOMINAL HYSTERECTOMY  1982   APPENDECTOMY  2008    Family History  Problem Relation Age of Onset   Heart disease Mother    Hyperlipidemia Mother    Hypertension Mother    Heart attack Mother    Lung cancer Brother     Allergies  Allergen Reactions   Penicillins Hives    Current Outpatient Medications on File Prior to Visit  Medication Sig Dispense Refill   amLODipine (NORVASC) 5 MG tablet Take 1 tablet (5 mg total) by mouth daily. 90 tablet 3   aspirin 81 MG tablet Take 81 mg by  mouth daily.     Calcium Carbonate-Vitamin D (CALCIUM-VITAMIN D3 PO) Take 1,000 mg by mouth daily.     Cholecalciferol (VITAMIN D3) 10000 units TABS Take by mouth.     irbesartan (AVAPRO) 150 MG tablet TAKE 1 TABLET BY MOUTH ONCE DAILY FOR BLOOD PRESSURE. (REPLACES LOSARTAN) 90 tablet 3   loratadine (CLARITIN) 10 MG tablet Take 10 mg by mouth daily.     lovastatin (MEVACOR) 20 MG tablet Take 1 tablet (20 mg total) by mouth at bedtime. For cholesterol. 90 tablet 3   Omega-3 Fatty Acids (FISH OIL) 1000 MG CAPS Take 1 capsule by mouth daily.     No current facility-administered medications on file prior to visit.    BP 116/70    Pulse 80    Temp (!) 96 F (35.6 C) (Temporal)    Ht 5\' 7"  (1.702 m)    Wt 148 lb 8 oz (67.4 kg)    SpO2 98%    BMI 23.26 kg/m    Objective:   Physical Exam Neck:      Comments: Decrease in ROM with most planes of movement, mores so with extension.  Musculoskeletal:     Cervical back: Pain with movement and muscular tenderness present. Decreased range of motion.  Neurological:     Mental Status: She is alert.            Assessment & Plan:

## 2019-10-07 NOTE — Assessment & Plan Note (Signed)
Acute for the last 2 weeks, little improvement with Tylenol and Advil.  Exam and HPI today consistent for arthritis. Check plain films today.  Discussed to apply diclofenac gel. Rx for tizanidine sent to pharmacy, drowsiness precautions provided.  Recommended stretching, heat. She will update.

## 2019-10-07 NOTE — Patient Instructions (Addendum)
You can apply the diclofenac (Voltaren) gel to your neck three times daily as needed.  You may take tizanidine (muscle relaxer) at bedtime as needed for muscle spasm. This may cause drowsiness.   Complete xray(s) prior to leaving today. I will notify you of your results once received.  Please update me in a few days! It was a pleasure to see you today!

## 2019-10-08 DIAGNOSIS — M542 Cervicalgia: Secondary | ICD-10-CM

## 2019-10-08 MED ORDER — MELOXICAM 15 MG PO TABS: 15.0000 mg | ORAL_TABLET | Freq: Every day | ORAL | 0 refills | Status: DC

## 2019-10-09 ENCOUNTER — Encounter: Payer: Self-pay | Admitting: Physical Medicine & Rehabilitation

## 2019-10-29 ENCOUNTER — Encounter: Payer: Medicare Other | Admitting: Physical Medicine & Rehabilitation

## 2019-11-26 ENCOUNTER — Other Ambulatory Visit: Payer: Self-pay

## 2019-11-26 ENCOUNTER — Encounter: Payer: Medicare Other | Attending: Physical Medicine & Rehabilitation | Admitting: Physical Medicine & Rehabilitation

## 2019-11-26 ENCOUNTER — Encounter: Payer: Self-pay | Admitting: Physical Medicine & Rehabilitation

## 2019-11-26 VITALS — BP 138/79 | HR 67 | Temp 94.5°F | Ht 67.0 in | Wt 142.8 lb

## 2019-11-26 DIAGNOSIS — M47812 Spondylosis without myelopathy or radiculopathy, cervical region: Secondary | ICD-10-CM | POA: Insufficient documentation

## 2019-11-26 NOTE — Patient Instructions (Signed)
PT will call you   Get ICY or Biofreeze

## 2019-11-26 NOTE — Progress Notes (Signed)
Subjective:    Patient ID: Rachel Bowers, female    DOB: May 05, 1940, 79 y.o.   MRN: [id number]  HPI  CC:  Neck pain Started after pt was lugging items to and from hospital where her husband was an inpt in June 2021.  Patient also notes that she changed her pillows and this may have had some improvements of her neck pain.  She had no falls or trauma no motor vehicle accident.  She has had no prior injury or surgery to the cervical spine area Also had pain shooting down arm, left shoulder but this has subsided.  Initially had spasms which have subsided, no longer taking tizanidine  Pain improved from 9/10 to 2/10 but occasionally flares up  Has arthritis in Right hand more so than left hand  CERVICAL SPINE - 2-3 VIEW  COMPARISON:  CT 12/09/2013  FINDINGS: Alignment is normal. Pronounced facet arthropathy on both sides which could be painful. Degenerative spondylosis C5-6 with endplate osteophytes.  IMPRESSION: Pronounced cervical facet arthropathy which could be painful. 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   Electronically Signed   By: Nelson Chimes M.D.   On: 10/07/2019 16:31 Pain Inventory Average Pain 9 Pain Right Now 2 My pain is intermittent and sharp  In the last 24 hours, has pain interfered with the following? General activity 0 Relation with others 0 Enjoyment of life 0 What TIME of day is your pain at its worst? daytime Sleep (in general) Fair  Pain is worse with: bending and some activites Pain improves with: heat/ice and medication Relief from Meds: 9  how many minutes can you walk? 30-45 ability to climb steps?  yes do you drive?  yes  retired  bladder control problems spasms  New pt  New pt    Family History  Problem Relation Age of Onset  . Heart disease Mother   . Hyperlipidemia Mother   . Hypertension Mother   . Heart attack Mother   . Lung cancer Brother    Social History   Socioeconomic History  . Marital status: Married      Spouse name: Not on file  . Number of children: Not on file  . Years of education: Not on file  . Highest education level: Not on file  Occupational History  . Occupation: retired  Tobacco Use  . Smoking status: Former Smoker    Types: Cigarettes    Quit date: 01/25/1974    Years since quitting: 45.8  . Smokeless tobacco: Never Used  Vaping Use  . Vaping Use: Never used  Substance and Sexual Activity  . Alcohol use: Yes    Alcohol/week: 1.0 standard drink    Types: 1 Glasses of wine per week  . Drug use: No  . Sexual activity: Not on file  Other Topics Concern  . Not on file  Social History Narrative   Married.   1 daughter and 1 son. 2 grandchildren.   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   Enjoys exercising at the [facility], reading.    Social Determinants of Health   Financial Resource Strain: Low Risk   . Difficulty of Paying Living Expenses: Not hard at all  Food Insecurity: No Food Insecurity  . Worried About Charity fundraiser in the Last Year: Never true  . Ran Out of Food in the Last Year: Never true  Transportation Needs: No Transportation Needs  . Lack of Transportation (Medical): No  .  Lack of Transportation (Non-Medical): No  Physical Activity: Sufficiently Active  . Days of Exercise per Week: 7 days  . Minutes of Exercise per Session: 30 min  Stress: No Stress Concern Present  . Feeling of Stress : Not at all  Social Connections:   . Frequency of Communication with Friends and Family: Not on file  . Frequency of Social Gatherings with Friends and Family: Not on file  . Attends Religious Services: Not on file  . Active Member of Clubs or Organizations: Not on file  . Attends Archivist Meetings: Not on file  . Marital Status: Not on file   Past Surgical History:  Procedure Laterality Date  . ABDOMINAL HYSTERECTOMY  1982  . APPENDECTOMY  2008   Past Medical History:  Diagnosis Date  .  Arthritis   . 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 Diverticulosis    per colonoscopy  . Heart murmur   . Hyperlipidemia   . Hypertension    BP 138/79   Pulse 67   Temp (!) 94.5 F (34.7 C) (Axillary)   Ht 5\' 7"  (1.702 m)   Wt 142 lb 12.8 oz (64.8 kg)   SpO2 98%   BMI 22.37 kg/m   Opioid Risk Score:   Fall Risk Score:  `1  Depression screen PHQ 2/9  Depression screen [facility] 2/9 11/26/2019 04/07/2019 03/28/2018 03/27/2017 03/21/2016  Decreased Interest 2 0 0 1 0  Down, Depressed, Hopeless 2 0 0 1 1  PHQ - 2 Score 4 0 0 2 1  Altered sleeping 2 0 0 2 -  Tired, decreased energy 2 0 0 0 -  Change in appetite 2 0 0 0 -  Feeling bad or failure about yourself  2 0 0 1 -  Trouble concentrating 2 0 0 0 -  Moving slowly or fidgety/restless 0 0 0 0 -  Suicidal thoughts 0 0 0 0 -  PHQ-9 Score 14 0 0 5 -  Difficult doing work/chores Somewhat difficult Not difficult at all Not difficult at all Not difficult at all -    Review of Systems  Constitutional: Positive for diaphoresis.  Genitourinary:       Bladder control   Musculoskeletal:       Spasms  All other systems reviewed and are negative.      Objective:   Physical Exam Vitals and nursing note reviewed.  Constitutional:      Appearance: Normal appearance.  HENT:     Head: Normocephalic and atraumatic.  Eyes:     Extraocular Movements: Extraocular movements intact.     Conjunctiva/sclera: Conjunctivae normal.     Pupils: Pupils are equal, round, and reactive to light.  Neck:     Comments: Reduced cervical spine range of motion approximately 50% rotation as well as flexion and extension but no pain with range of motion.  Negative Spurling's, negative foraminal compression Cardiovascular:     Rate and Rhythm: Normal rate and regular rhythm.     Heart sounds: Normal heart sounds.  Pulmonary:     Effort: Pulmonary effort is normal. No respiratory distress.     Breath sounds: Normal breath sounds.  Abdominal:      General: Abdomen is flat. Bowel sounds are normal. There is no distension.     Palpations: Abdomen is soft.  Musculoskeletal:        General: No swelling or tenderness.     Comments: Mild tenderness to palpation left upper trap as well as  left upper medial scapular border. Patient with Heberden's nodules bilateral hands enlargement of the PIPs particularly right digits 2 and 3.  No pain to palpation of the MCP PIPs or DIPs.  No evidence of synovitis at the wrist.  Shoulder range of motion is normal negative impingement signs Knees without evidence of effusion normal range of motion lower extremities Gait without evidence of toe drag or knee instability  Skin:    General: Skin is warm.  Neurological:     Mental Status: She is alert and oriented to person, place, and time.     Comments: Motor strength is 5/5 bilateral deltoid bicep tricep grip hip flexor knee extensor ankle dorsiflexor Sensation normal bilateral lower extremities Reduced sensation left C4, normal pinprick sensation bilateral C5 C6-C7-C8 Deep tendon reflexes are 1+ left biceps brachioradialis and triceps 2+ right biceps brachioradialis 1+ right triceps 2+ bilateral patellar and Achilles  Psychiatric:        Mood and Affect: Mood normal.        Behavior: Behavior normal.        Thought Content: Thought content normal.        Judgment: Judgment normal.           Assessment & Plan:  #1.  Cervicalgia, exam and cervical spine x-rays consistent with cervical spondylosis with probable C3-4 foraminal impingement, personally reviewed x-rays that show facet hypertrophy left C3-4, not noted in radiologic report Does have some reduced sensation C4 dermatomal distribution but this is not of any functional concern was not aware of this. May have had a C5 or C6 cervical radiculopathy but this has improved. Given that patient has underlying arthritis, some intermittent symptoms would make referral to physical therapy and advised that  she keeps up with home exercise program on ongoing basis.  I will see her back in 6 weeks to follow-up.  I do not think any interventional spine procedures are needed at this time

## 2019-12-31 DIAGNOSIS — L821 Other seborrheic keratosis: Secondary | ICD-10-CM | POA: Diagnosis not present

## 2019-12-31 DIAGNOSIS — D1801 Hemangioma of skin and subcutaneous tissue: Secondary | ICD-10-CM | POA: Diagnosis not present

## 2019-12-31 DIAGNOSIS — D225 Melanocytic nevi of trunk: Secondary | ICD-10-CM | POA: Diagnosis not present

## 2019-12-31 DIAGNOSIS — Z85828 Personal history of other malignant neoplasm of skin: Secondary | ICD-10-CM | POA: Diagnosis not present

## 2019-12-31 DIAGNOSIS — L814 Other melanin hyperpigmentation: Secondary | ICD-10-CM | POA: Diagnosis not present

## 2020-01-07 ENCOUNTER — Encounter: Payer: Medicare Other | Attending: Physical Medicine & Rehabilitation | Admitting: Physical Medicine & Rehabilitation

## 2020-01-07 DIAGNOSIS — M47812 Spondylosis without myelopathy or radiculopathy, cervical region: Secondary | ICD-10-CM | POA: Insufficient documentation

## 2020-01-09 ENCOUNTER — Other Ambulatory Visit: Payer: Self-pay

## 2020-01-09 ENCOUNTER — Ambulatory Visit (INDEPENDENT_AMBULATORY_CARE_PROVIDER_SITE_OTHER): Payer: Medicare Other

## 2020-01-09 DIAGNOSIS — Z23 Encounter for immunization: Secondary | ICD-10-CM

## 2020-03-03 ENCOUNTER — Other Ambulatory Visit: Payer: Self-pay | Admitting: Primary Care

## 2020-03-03 DIAGNOSIS — E785 Hyperlipidemia, unspecified: Secondary | ICD-10-CM

## 2020-03-15 ENCOUNTER — Other Ambulatory Visit: Payer: Self-pay | Admitting: Primary Care

## 2020-03-15 DIAGNOSIS — I10 Essential (primary) hypertension: Secondary | ICD-10-CM

## 2020-03-16 NOTE — Telephone Encounter (Signed)
Pharmacy requests refill on: Amlodipine 5 mg   LAST REFILL: 04/09/2019 (Q-90, R-0)  LAST OV: 10/07/2019 NEXT OV: Not Scheduled  PHARMACY: [place], [place]

## 2020-03-30 DIAGNOSIS — H43811 Vitreous degeneration, right eye: Secondary | ICD-10-CM | POA: Diagnosis not present

## 2020-03-30 DIAGNOSIS — H0288B Meibomian gland dysfunction left eye, upper and lower eyelids: Secondary | ICD-10-CM | POA: Diagnosis not present

## 2020-03-30 DIAGNOSIS — H1045 Other chronic allergic conjunctivitis: Secondary | ICD-10-CM | POA: Diagnosis not present

## 2020-03-30 DIAGNOSIS — H0288A Meibomian gland dysfunction right eye, upper and lower eyelids: Secondary | ICD-10-CM | POA: Diagnosis not present

## 2020-03-30 DIAGNOSIS — H43393 Other vitreous opacities, bilateral: Secondary | ICD-10-CM | POA: Diagnosis not present

## 2020-04-05 DIAGNOSIS — Z1231 Encounter for screening mammogram for malignant neoplasm of breast: Secondary | ICD-10-CM | POA: Diagnosis not present

## 2020-04-05 LAB — HM MAMMOGRAPHY

## 2020-04-15 ENCOUNTER — Encounter: Payer: Medicare Other | Admitting: Primary Care

## 2020-04-16 ENCOUNTER — Encounter: Payer: Self-pay | Admitting: Primary Care

## 2020-04-28 ENCOUNTER — Other Ambulatory Visit: Payer: Self-pay | Admitting: Primary Care

## 2020-04-28 DIAGNOSIS — I10 Essential (primary) hypertension: Secondary | ICD-10-CM

## 2020-04-29 ENCOUNTER — Encounter: Payer: Medicare Other | Admitting: Primary Care

## 2020-05-20 ENCOUNTER — Encounter: Payer: Medicare Other | Admitting: Primary Care

## 2020-06-02 ENCOUNTER — Ambulatory Visit (INDEPENDENT_AMBULATORY_CARE_PROVIDER_SITE_OTHER): Payer: Medicare Other | Admitting: Primary Care

## 2020-06-02 ENCOUNTER — Other Ambulatory Visit: Payer: Self-pay

## 2020-06-02 ENCOUNTER — Encounter: Payer: Self-pay | Admitting: Primary Care

## 2020-06-02 VITALS — BP 118/72 | HR 64 | Temp 98.1°F | Ht 66.25 in | Wt 147.8 lb

## 2020-06-02 DIAGNOSIS — M542 Cervicalgia: Secondary | ICD-10-CM

## 2020-06-02 DIAGNOSIS — Z0000 Encounter for general adult medical examination without abnormal findings: Secondary | ICD-10-CM | POA: Diagnosis not present

## 2020-06-02 DIAGNOSIS — Z638 Other specified problems related to primary support group: Secondary | ICD-10-CM

## 2020-06-02 DIAGNOSIS — Z1159 Encounter for screening for other viral diseases: Secondary | ICD-10-CM

## 2020-06-02 DIAGNOSIS — E785 Hyperlipidemia, unspecified: Secondary | ICD-10-CM | POA: Diagnosis not present

## 2020-06-02 DIAGNOSIS — M8949 Other hypertrophic osteoarthropathy, multiple sites: Secondary | ICD-10-CM | POA: Diagnosis not present

## 2020-06-02 DIAGNOSIS — M159 Polyosteoarthritis, unspecified: Secondary | ICD-10-CM

## 2020-06-02 DIAGNOSIS — R7303 Prediabetes: Secondary | ICD-10-CM

## 2020-06-02 DIAGNOSIS — I10 Essential (primary) hypertension: Secondary | ICD-10-CM | POA: Diagnosis not present

## 2020-06-02 DIAGNOSIS — E559 Vitamin D deficiency, unspecified: Secondary | ICD-10-CM | POA: Diagnosis not present

## 2020-06-02 DIAGNOSIS — M150 Primary generalized (osteo)arthritis: Secondary | ICD-10-CM

## 2020-06-02 DIAGNOSIS — G8929 Other chronic pain: Secondary | ICD-10-CM

## 2020-06-02 DIAGNOSIS — I6529 Occlusion and stenosis of unspecified carotid artery: Secondary | ICD-10-CM | POA: Diagnosis not present

## 2020-06-02 DIAGNOSIS — J3089 Other allergic rhinitis: Secondary | ICD-10-CM

## 2020-06-02 DIAGNOSIS — M5442 Lumbago with sciatica, left side: Secondary | ICD-10-CM | POA: Diagnosis not present

## 2020-06-02 LAB — CBC
HCT: 39.6 % (ref 36.0–46.0)
Hemoglobin: 13.3 g/dL (ref 12.0–15.0)
MCHC: 33.6 g/dL (ref 30.0–36.0)
MCV: 91.1 fl (ref 78.0–100.0)
Platelets: 282 10*3/uL (ref 150.0–400.0)
RBC: 4.34 Mil/uL (ref 3.87–5.11)
RDW: 13.2 % (ref 11.5–15.5)
WBC: 5.9 10*3/uL (ref 4.0–10.5)

## 2020-06-02 LAB — COMPREHENSIVE METABOLIC PANEL
ALT: 18 U/L (ref 0–35)
AST: 16 U/L (ref 0–37)
Albumin: 4.5 g/dL (ref 3.5–5.2)
Alkaline Phosphatase: 53 U/L (ref 39–117)
BUN: 22 mg/dL (ref 6–23)
CO2: 30 mEq/L (ref 19–32)
Calcium: 10 mg/dL (ref 8.4–10.5)
Chloride: 103 mEq/L (ref 96–112)
Creatinine, Ser: 0.83 mg/dL (ref 0.40–1.20)
GFR: 67.05 mL/min (ref >60.00)
Glucose, Bld: 99 mg/dL (ref 70–99)
Potassium: 4 mEq/L (ref 3.5–5.1)
Sodium: 139 mEq/L (ref 135–145)
Total Bilirubin: 0.6 mg/dL (ref 0.2–1.2)
Total Protein: 7.3 g/dL (ref 6.0–8.3)

## 2020-06-02 LAB — VITAMIN D 25 HYDROXY (VIT D DEFICIENCY, FRACTURES): VITD: 76.22 ng/mL (ref 30.00–100.00)

## 2020-06-02 LAB — LIPID PANEL
Cholesterol: 186 mg/dL (ref 0–200)
HDL: 74.1 mg/dL (ref >39.00)
LDL Cholesterol: 92 mg/dL (ref 0–99)
NonHDL: 112.13
Total CHOL/HDL Ratio: 3
Triglycerides: 103 mg/dL (ref 0.0–149.0)
VLDL: 20.6 mg/dL (ref 0.0–40.0)

## 2020-06-02 MED ORDER — MELOXICAM 15 MG PO TABS: 15.0000 mg | ORAL_TABLET | Freq: Every day | ORAL | 0 refills | Status: DC

## 2020-06-02 MED ORDER — TIZANIDINE HCL 4 MG PO TABS
4.0000 mg | ORAL_TABLET | Freq: Every evening | ORAL | 0 refills | Status: DC | PRN
Start: 2020-06-02 — End: 2021-06-14

## 2020-06-02 NOTE — Assessment & Plan Note (Signed)
Doing well on PRN Meloxicam 15 mg and Tizanidine 4 mg. Continue same.

## 2020-06-02 NOTE — Assessment & Plan Note (Signed)
Overall stable, not taking OTC daily, uses Meloxicam 15 mg and Tizanidine 4 mg PRN, refills provided, she uses sparingly.

## 2020-06-02 NOTE — Assessment & Plan Note (Signed)
Compliant to vitamin D3 10,000 IU daily. Vitamin D level pending.

## 2020-06-02 NOTE — Assessment & Plan Note (Signed)
Immunizations UTD. Mammogram and bone density scan UTD. Colonoscopy UTD, declines further imaging.  Discussed the importance of a healthy diet and regular exercise in order for weight loss, and to reduce the risk of any potential medical problems.  Exam stable. Labs pending.

## 2020-06-02 NOTE — Assessment & Plan Note (Signed)
Compliant to lovastatin 20 mg, repeat lipid panel pending. Continue same.

## 2020-06-02 NOTE — Assessment & Plan Note (Signed)
Chronic for the last year with her husband. Support provided today.  Offered therapy, other treatment for which she kindly declines. Continue to monitor.

## 2020-06-02 NOTE — Patient Instructions (Signed)
Stop by the lab prior to leaving today. I will notify you of your results once received.   Start exercising. You should be getting 150 minutes of exercise weekly.  Continue to work on a healthy diet. Ensure you are consuming 64 ounces of water daily.  It was a pleasure to see you today!   Preventive Care 80 Years and Older, Female Preventive care refers to lifestyle choices and visits with your health care provider that can promote health and wellness. This includes:  A yearly physical exam. This is also called an annual wellness visit.  Regular dental and eye exams.  Immunizations.  Screening for certain conditions.  Healthy lifestyle choices, such as: ? Eating a healthy diet. ? Getting regular exercise. ? Not using drugs or products that contain nicotine and tobacco. ? Limiting alcohol use. What can I expect for my preventive care visit? Physical exam Your health care provider will check your:  Height and weight. These may be used to calculate your BMI (body mass index). BMI is a measurement that tells if you are at a healthy weight.  Heart rate and blood pressure.  Body temperature.  Skin for abnormal spots. Counseling Your health care provider may ask you questions about your:  Past medical problems.  Family's medical history.  Alcohol, tobacco, and drug use.  Emotional well-being.  Home life and relationship well-being.  Sexual activity.  Diet, exercise, and sleep habits.  History of falls.  Memory and ability to understand (cognition).  Work and work Statistician.  Pregnancy and menstrual history.  Access to firearms. What immunizations do I need? Vaccines are usually given at various ages, according to a schedule. Your health care provider will recommend vaccines for you based on your age, medical history, and lifestyle or other factors, such as travel or where you work.   What tests do I need? Blood tests  Lipid and cholesterol levels. These  may be checked every 5 years, or more often depending on your overall health.  Hepatitis C test.  Hepatitis B test. Screening  Lung cancer screening. You may have this screening every year starting at age 71 if you have a 30-pack-year history of smoking and currently smoke or have quit within the past 15 years.  Colorectal cancer screening. ? All adults should have this screening starting at age 93 and continuing until age 60. ? Your health care provider may recommend screening at age 50 if you are at increased risk. ? You will have tests every 1-10 years, depending on your results and the type of screening test.  Diabetes screening. ? This is done by checking your blood sugar (glucose) after you have not eaten for a while (fasting). ? You may have this done every 1-3 years.  Mammogram. ? This may be done every 1-2 years. ? Talk with your health care provider about how often you should have regular mammograms.  Abdominal aortic aneurysm (AAA) screening. You may need this if you are a current or former smoker.  BRCA-related cancer screening. This may be done if you have a family history of breast, ovarian, tubal, or peritoneal cancers. Other tests  STD (sexually transmitted disease) testing, if you are at risk.  Bone density scan. This is done to screen for osteoporosis. You may have this done starting at age 20. Talk with your health care provider about your test results, treatment options, and if necessary, the need for more tests. Follow these instructions at home: Eating and drinking  Eat a  diet that includes fresh fruits and vegetables, whole grains, lean protein, and low-fat dairy products. Limit your intake of foods with high amounts of sugar, saturated fats, and salt.  Take vitamin and mineral supplements as recommended by your health care provider.  Do not drink alcohol if your health care provider tells you not to drink.  If you drink alcohol: ? Limit how much you  have to 0-1 drink a day. ? Be aware of how much alcohol is in your drink. In the [place] one drink equals one 12 oz bottle of beer (355 mL), one 5 oz glass of wine (148 mL), or one 1 oz glass of hard liquor (44 mL).   Lifestyle  Take daily care of your teeth and gums. Brush your teeth every morning and night with fluoride toothpaste. Floss one time each day.  Stay active. Exercise for at least 30 minutes 5 or more days each week.  Do not use any products that contain nicotine or tobacco, such as cigarettes, e-cigarettes, and chewing tobacco. If you need help quitting, ask your health care provider.  Do not use drugs.  If you are sexually active, practice safe sex. Use a condom or other form of protection in order to prevent STIs (sexually transmitted infections).  Talk with your health care provider about taking a low-dose aspirin or statin.  Find healthy ways to cope with stress, such as: ? Meditation, yoga, or listening to music. ? Journaling. ? Talking to a trusted person. ? Spending time with friends and family. Safety  Always wear your seat belt while driving or riding in a vehicle.  Do not drive: ? If you have been drinking alcohol. Do not ride with someone who has been drinking. ? When you are tired or distracted. ? While texting.  Wear a helmet and other protective equipment during sports activities.  If you have firearms in your house, make sure you follow all gun safety procedures. What's next?  Visit your health care provider once a year for an annual wellness visit.  Ask your health care provider how often you should have your eyes and teeth checked.  Stay up to date on all vaccines. This information is not intended to replace advice given to you by your health care provider. Make sure you discuss any questions you have with your health care provider. Document Revised: 03/16/2020 Document Reviewed: 03/20/2018 Elsevier Patient Education  2021 [employer].

## 2020-06-02 NOTE — Progress Notes (Signed)
Subjective:    Patient ID: Rachel Bowers, female    DOB: 1941/03/02, 80 y.o.   MRN: [id number]  HPI  This visit occurred during the SARS-CoV-2 public health emergency.  Safety protocols were in place, including screening questions prior to the visit, additional usage of staff PPE, and extensive cleaning of exam room while observing appropriate contact time as indicated for disinfecting solutions.   Rachel Bowers is a 80 year old female who presents today for complete physical.  Immunizations: -Tetanus: 2014 -Influenza: Completed this season -Shingles: Completed Zostavax -Pneumonia: Completed Prevnar in 2015, Pneumovax in 2009 -Covid-19: Completed 3 vaccines  Diet: She endorses a health diet.  Exercise: She is not exercising much, is caring for her chronically ill husband.   Eye exam: Completes annually Dental exam: Completes semi-annually   Mammogram: Completed in December 2021 Dexa: Completed in February 2021 Colonoscopy: Completed in 2016, patient declines further screening Hep C Screen: Due today  BP Readings from Last 3 Encounters:  06/02/20 118/72  11/26/19 138/79  10/07/19 116/70      Review of Systems  Constitutional: Negative for unexpected weight change.  HENT: Negative for rhinorrhea.   Eyes: Negative for visual disturbance.  Respiratory: Negative for cough and shortness of breath.   Cardiovascular: Negative for chest pain.  Gastrointestinal: Negative for constipation and diarrhea.  Genitourinary: Negative for difficulty urinating and menstrual problem.  Musculoskeletal: Positive for arthralgias.  Skin: Negative for rash.  Allergic/Immunologic: Negative for environmental allergies.  Neurological: Negative for dizziness, numbness and headaches.  Psychiatric/Behavioral:       Care giver strain, overall manages well.       Past Medical History:  Diagnosis Date  . Arthritis   . 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 Diverticulosis    per colonoscopy  .  Heart murmur   . Hyperlipidemia   . Hypertension      Social History   Socioeconomic History  . Marital status: Married    Spouse name: Not on file  . Number of children: Not on file  . Years of education: Not on file  . Highest education level: Not on file  Occupational History  . Occupation: retired  Tobacco Use  . Smoking status: Former Smoker    Types: Cigarettes    Quit date: 01/25/1974    Years since quitting: 46.3  . Smokeless tobacco: Never Used  Vaping Use  . Vaping Use: Never used  Substance and Sexual Activity  . Alcohol use: Yes    Alcohol/week: 1.0 standard drink    Types: 1 Glasses of wine per week  . Drug use: No  . Sexual activity: Not on file  Other Topics Concern  . Not on file  Social History Narrative   Married.   1 daughter and 1 son. 2 grandchildren.   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   Enjoys exercising at the [facility], reading.    Social Determinants of Health   Financial Resource Strain: Not on file  Food Insecurity: Not on file  Transportation Needs: Not on file  Physical Activity: Not on file  Stress: Not on file  Social Connections: Not on file  Intimate Partner Violence: Not on file    Past Surgical History:  Procedure Laterality Date  . ABDOMINAL HYSTERECTOMY  1982  . APPENDECTOMY  2008    Family History  Problem Relation Age of Onset  . Heart disease Mother   . Hyperlipidemia Mother   .  Hypertension Mother   . Heart attack Mother   . Lung cancer Brother     Allergies  Allergen Reactions  . Penicillins Hives    Current Outpatient Medications on File Prior to Visit  Medication Sig Dispense Refill  . amLODipine (NORVASC) 5 MG tablet TAKE 1 TABLET BY MOUTH  DAILY 90 tablet 0  . aspirin 81 MG tablet Take 81 mg by mouth daily.    . Calcium Carbonate-Vitamin D (CALCIUM-VITAMIN D3 PO) Take 1,000 mg by mouth daily.    . Cholecalciferol (VITAMIN D3) 10000 units TABS Take by  mouth.    . diclofenac Sodium (VOLTAREN) 1 % GEL Apply 2 g topically 3 (three) times daily as needed. 100 g 0  . irbesartan (AVAPRO) 150 MG tablet TAKE 1 TABLET BY MOUTH ONCE DAILY FOR BLOOD PRESSURE. (REPLACES LOSARTAN) 90 tablet 0  . loratadine (CLARITIN) 10 MG tablet Take 10 mg by mouth daily.    Marland Kitchen lovastatin (MEVACOR) 20 MG tablet TAKE 1 TABLET BY MOUTH AT  BEDTIME FOR CHOLESTEROL 90 tablet 3  . Omega-3 Fatty Acids (FISH OIL) 1000 MG CAPS Take 1 capsule by mouth daily.     No current facility-administered medications on file prior to visit.    BP 118/72   Pulse 64   Temp 98.1 F (36.7 C) (Temporal)   Ht 5' 6.25" (1.683 m)   Wt 147 lb 12 oz (67 kg)   SpO2 99%   BMI 23.67 kg/m    Objective:   Physical Exam Constitutional:      Appearance: She is well-nourished.  HENT:     Right Ear: Tympanic membrane and ear canal normal.     Left Ear: Tympanic membrane and ear canal normal.     Mouth/Throat:     Mouth: Oropharynx is clear and moist.  Eyes:     Extraocular Movements: EOM normal.     Pupils: Pupils are equal, round, and reactive to light.  Cardiovascular:     Rate and Rhythm: Normal rate and regular rhythm.  Pulmonary:     Effort: Pulmonary effort is normal.     Breath sounds: Normal breath sounds.  Abdominal:     General: Bowel sounds are normal.     Palpations: Abdomen is soft.     Tenderness: There is no abdominal tenderness.  Musculoskeletal:        General: Normal range of motion.     Cervical back: Neck supple.  Skin:    General: Skin is warm and dry.  Neurological:     Mental Status: She is alert and oriented to person, place, and time.     Cranial Nerves: No cranial nerve deficit.     Deep Tendon Reflexes:     Reflex Scores:      Patellar reflexes are 2+ on the right side and 2+ on the left side. Psychiatric:        Mood and Affect: Mood and affect and mood normal.            Assessment & Plan:

## 2020-06-02 NOTE — Assessment & Plan Note (Addendum)
Compliant to aspirin and lovastatin 20 mg, continue same. Reviewed carotid ultrasound from 2020, she is to be scheduled soon for repeat imaging.

## 2020-06-02 NOTE — Assessment & Plan Note (Signed)
Stable  Continue Claritin

## 2020-06-02 NOTE — Assessment & Plan Note (Signed)
Doing well on PRN Meloxicam 15 mg and Tizanidine 4 mg, continue same.

## 2020-06-02 NOTE — Assessment & Plan Note (Signed)
Well controlled in the office today, continue amlodipine 5 mg and irbesartan 150 mg daily.   CMP pending.

## 2020-06-02 NOTE — Assessment & Plan Note (Signed)
Repeat A1C pending.  Discussed the importance of a healthy diet and regular exercise in order for weight loss, and to reduce the risk of any potential medical problems.  

## 2020-06-03 LAB — HEPATITIS C ANTIBODY
Hepatitis C Ab: NONREACTIVE
SIGNAL TO CUT-OFF: 0 (ref <1.00)

## 2020-06-06 ENCOUNTER — Other Ambulatory Visit: Payer: Self-pay | Admitting: Primary Care

## 2020-06-06 DIAGNOSIS — I10 Essential (primary) hypertension: Secondary | ICD-10-CM

## 2020-07-21 ENCOUNTER — Ambulatory Visit (INDEPENDENT_AMBULATORY_CARE_PROVIDER_SITE_OTHER): Payer: Medicare Other

## 2020-07-21 ENCOUNTER — Other Ambulatory Visit: Payer: Self-pay

## 2020-07-21 DIAGNOSIS — Z0000 Encounter for general adult medical examination without abnormal findings: Secondary | ICD-10-CM | POA: Diagnosis not present

## 2020-07-21 NOTE — Progress Notes (Signed)
Subjective:   Rachel Bowers is a 80 y.o. female who presents for Medicare Annual (Subsequent) preventive examination.  Review of Systems: N/A      I connected with the patient today by telephone and verified that I am speaking with the correct person using two identifiers. Location patient: home Location nurse: work Persons participating in the telephone visit: patient, nurse.   I discussed the limitations, risks, security and privacy concerns of performing an evaluation and management service by telephone and the availability of in person appointments. I also discussed with the patient that there may be a patient responsible charge related to this service. The patient expressed understanding and verbally consented to this telephonic visit.        Cardiac Risk Factors include: advanced age (>69men, >20 women);hypertension;Other (see comment), Risk factor comments: hyperlipidemia     Objective:    Today's Vitals   There is no height or weight on file to calculate BMI.  Advanced Directives 07/21/2020 04/07/2019 03/28/2018 03/27/2017 01/31/2015 01/25/2014  Does Patient Have a Medical Advance Directive? Yes Yes Yes Yes Yes Yes  Type of Paramedic of [place];Living will [place];Living will [place];Living will [place];Living will [place];Living will [place];Living will  Does patient want to make changes to medical advance directive? - - - - No - Patient declined -  Copy of [place] in Chart? No - copy requested No - copy requested No - copy requested No - copy requested No - copy requested No - copy requested    Current Medications (verified) Outpatient Encounter Medications as of 07/21/2020  Medication Sig  . amLODipine (NORVASC) 5 MG tablet TAKE 1 TABLET BY MOUTH  DAILY  . aspirin 81 MG tablet Take 81 mg by mouth daily.  . Calcium  Carbonate-Vitamin D (CALCIUM-VITAMIN D3 PO) Take 1,000 mg by mouth daily.  . Cholecalciferol (VITAMIN D3) 10000 units TABS Take by mouth.  . diclofenac Sodium (VOLTAREN) 1 % GEL Apply 2 g topically 3 (three) times daily as needed.  . irbesartan (AVAPRO) 150 MG tablet TAKE 1 TABLET BY MOUTH ONCE DAILY FOR BLOOD PRESSURE. (REPLACES LOSARTAN)  . loratadine (CLARITIN) 10 MG tablet Take 10 mg by mouth daily.  Marland Kitchen lovastatin (MEVACOR) 20 MG tablet TAKE 1 TABLET BY MOUTH AT  BEDTIME FOR CHOLESTEROL  . meloxicam (MOBIC) 15 MG tablet Take 1 tablet (15 mg total) by mouth daily. As needed for pain.  . Omega-3 Fatty Acids (FISH OIL) 1000 MG CAPS Take 1 capsule by mouth daily.  Marland Kitchen tiZANidine (ZANAFLEX) 4 MG tablet Take 1 tablet (4 mg total) by mouth at bedtime as needed for muscle spasms.   No facility-administered encounter medications on file as of 07/21/2020.    Allergies (verified) Penicillins   History: Past Medical History:  Diagnosis Date  . Arthritis   . 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 Diverticulosis    per colonoscopy  . Heart murmur   . Hyperlipidemia   . Hypertension    Past Surgical History:  Procedure Laterality Date  . ABDOMINAL HYSTERECTOMY  1982  . APPENDECTOMY  2008   Family History  Problem Relation Age of Onset  . Heart disease Mother   . Hyperlipidemia Mother   . Hypertension Mother   . Heart attack Mother   . Lung cancer Brother    Social History   Socioeconomic History  . Marital status: Married  Spouse name: Not on file  . Number of children: Not on file  . Years of education: Not on file  . Highest education level: Not on file  Occupational History  . Occupation: retired  Tobacco Use  . Smoking status: Former Smoker    Types: Cigarettes    Quit date: 01/25/1974    Years since quitting: 46.5  . Smokeless tobacco: Never Used  Vaping Use  . Vaping Use: Never used  Substance and Sexual Activity  . Alcohol use: Yes    Alcohol/week: 1.0 standard  drink    Types: 1 Glasses of wine per week  . Drug use: No  . Sexual activity: Not on file  Other Topics Concern  . Not on file  Social History Narrative   Married.   1 daughter and 1 son. 2 grandchildren.   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   Enjoys exercising at the [facility], reading.    Social Determinants of Health   Financial Resource Strain: Low Risk   . Difficulty of Paying Living Expenses: Not hard at all  Food Insecurity: No Food Insecurity  . Worried About Charity fundraiser in the Last Year: Never true  . Ran Out of Food in the Last Year: Never true  Transportation Needs: No Transportation Needs  . Lack of Transportation (Medical): No  . Lack of Transportation (Non-Medical): No  Physical Activity: Inactive  . Days of Exercise per Week: 0 days  . Minutes of Exercise per Session: 0 min  Stress: No Stress Concern Present  . Feeling of Stress : Not at all  Social Connections: Not on file    Tobacco Counseling Counseling given: Not Answered   Clinical Intake:  Pre-visit preparation completed: Yes  Pain : No/denies pain     Nutritional Risks: None Diabetes: No  How often do you need to have someone help you when you read instructions, pamphlets, or other written materials from your doctor or pharmacy?: 1 - Never  Diabetic: No Nutrition Risk Assessment:  Has the patient had any N/V/D within the last 2 months?  No  Does the patient have any non-healing wounds?  No  Has the patient had any unintentional weight loss or weight gain?  No   Diabetes:  Is the patient diabetic?  No  If diabetic, was a CBG obtained today?  N/A Did the patient bring in their glucometer from home?  N/A How often do you monitor your CBG's? N/A.   Financial Strains and Diabetes Management:  Are you having any financial strains with the device, your supplies or your medication? N/A.  Does the patient want to be seen by Chronic Care  Management for management of their diabetes?  N/A Would the patient like to be referred to a Nutritionist or for Diabetic Management?  N/A Interpreter Needed?: No  Information entered by :: CJohnson, LPN   Activities of Daily Living In your present state of health, do you have any difficulty performing the following activities: 07/21/2020 06/02/2020  Hearing? N N  Vision? N N  Difficulty concentrating or making decisions? N N  Walking or climbing stairs? N N  Dressing or bathing? N N  Doing errands, shopping? N N  Preparing Food and eating ? N -  Using the Toilet? N -  In the past six months, have you accidently leaked urine? Y -  Comment low to moderate leakage -  Do you have problems with loss of  bowel control? N -  Managing your Medications? N -  Managing your Finances? N -  Housekeeping or managing your Housekeeping? N -  Some recent data might be hidden    Patient Care Team: Pleas Koch, NP as PCP - General (Internal Medicine)  Indicate any recent Medical Services you may have received from other than [facility] providers in the past year (date may be approximate).     Assessment:   This is a routine wellness examination for Kyrianna.  Hearing/Vision screen  Hearing Screening   125Hz  250Hz  500Hz  1000Hz  2000Hz  3000Hz  4000Hz  6000Hz  8000Hz   Right ear:           Left ear:           Vision Screening Comments: Patient gets annual eye exams   Dietary issues and exercise activities discussed: Current Exercise Habits: The patient does not participate in regular exercise at present, Exercise limited by: None identified  Goals    . Increase physical activity     Starting 03/28/2018, I will continue to walk for 30 minutes 5 days per week.     . Patient Stated     04/07/2019, I will maintain and continue medications as prescribed.     . Patient Stated     07/21/2020, I will maintain and continue medications as prescribed.       Depression Screen PHQ 2/9 Scores 07/21/2020  11/26/2019 04/07/2019 03/28/2018 03/27/2017 03/21/2016  PHQ - 2 Score 0 4 0 0 2 1  PHQ- 9 Score 0 14 0 0 5 -    Fall Risk Fall Risk  07/21/2020 11/26/2019 04/07/2019 03/28/2018 03/27/2017  Falls in the past year? 0 0 0 1 No  Comment - - - loss balance and fell on buttocks -  Number falls in past yr: 0 - 0 0 -  Injury with Fall? 0 - 0 1 -  Comment - - - lacerations to hands -  Risk for fall due to : Medication side effect - Medication side effect - -  Follow up Falls prevention discussed;Falls evaluation completed - Falls evaluation completed;Falls prevention discussed - -    FALL RISK PREVENTION PERTAINING TO THE HOME:  Any stairs in or around the home? Yes  If so, are there any without handrails? No  Home free of loose throw rugs in walkways, pet beds, electrical cords, etc? Yes  Adequate lighting in your home to reduce risk of falls? Yes   ASSISTIVE DEVICES UTILIZED TO PREVENT FALLS:  Life alert? No  Use of a cane, walker or w/c? No  Grab bars in the bathroom? No  Shower chair or bench in shower? No  Elevated toilet seat or a handicapped toilet? No   TIMED UP AND GO:  Was the test performed? N/A virtual/telephone visit .   Cognitive Function: MMSE - Mini Mental State Exam 07/21/2020 04/07/2019 03/28/2018 03/27/2017  Not completed: Refused - - -  Orientation to time - 5 5 5   Orientation to Place - 5 5 5   Registration - 3 3 3   Attention/ Calculation - 5 0 0  Recall - 3 3 3   Language- name 2 objects - - 0 0  Language- repeat - 1 1 1   Language- follow 3 step command - - 3 3  Language- read & follow direction - - 0 0  Write a sentence - - 0 0  Copy design - - 0 0  Total score - - 20 20  Mini Cog  Mini-Cog screen was  not completed. Patient refused. Maximum score is 22. A value of 0 denotes this part of the MMSE was not completed or the patient failed this part of the Mini-Cog screening.       Immunizations Immunization History  Administered Date(s) Administered  .  Fluad Quad(high Dose 65+) 12/19/2018, 01/09/2020  . Influenza,inj,Quad PF,6+ Mos 12/30/2015, 01/11/2017, 01/09/2018  . Influenza-Unspecified 12/24/2014  . PFIZER(Purple Top)SARS-COV-2 Vaccination 04/29/2019, 05/20/2019, 01/26/2020  . Pneumococcal Conjugate-13 02/22/2014  . Pneumococcal Polysaccharide-23 01/14/2006, 02/06/2008  . Tdap 02/17/2013  . Zoster 02/12/2011    TDAP status: Up to date  Flu Vaccine status: Up to date  Pneumococcal vaccine status: Up to date  Covid-19 vaccine status: Completed vaccines  Qualifies for Shingles Vaccine? Yes   Zostavax completed Yes   Shingrix Completed?: No.    Education has been provided regarding the importance of this vaccine. Patient has been advised to call insurance company to determine out of pocket expense if they have not yet received this vaccine. Advised may also receive vaccine at local pharmacy or Health Dept. Verbalized acceptance and understanding.  Screening Tests Health Maintenance  Topic Date Due  . INFLUENZA VACCINE  11/07/2020  . TETANUS/TDAP  02/18/2023  . DEXA SCAN  Completed  . COVID-19 Vaccine  Completed  . Hepatitis C Screening  Completed  . PNA vac Low Risk Adult  Completed  . HPV VACCINES  Aged Out    Health Maintenance  There are no preventive care reminders to display for this patient.  Colorectal cancer screening: No longer required.   Mammogram status: Completed 04/05/2020. Repeat every year  Bone Density status: Completed 05/19/2019. Results reflect: Bone density results: OSTEOPENIA. Repeat every 2 years.  Lung Cancer Screening: (Low Dose CT Chest recommended if Age 52-80 years, 30 pack-year currently smoking OR have quit w/in 15years.) does not qualify  Additional Screening:  Hepatitis C Screening: does qualify; Completed 06/02/2020  Vision Screening: Recommended annual ophthalmology exams for early detection of glaucoma and other disorders of the eye. Is the patient up to date with their annual eye  exam?  Yes  Who is the provider or what is the name of the office in which the patient attends annual eye exams? Dr. Katy Fitch If pt is not established with a provider, would they like to be referred to a provider to establish care? No .   Dental Screening: Recommended annual dental exams for proper oral hygiene  Community Resource Referral / Chronic Care Management: CRR required this visit?  No   CCM required this visit?  No      Plan:     I have personally reviewed and noted the following in the patient's chart:   . Medical and social history . Use of alcohol, tobacco or illicit drugs  . Current medications and supplements . Functional ability and status . Nutritional status . Physical activity . Advanced directives . List of other physicians . Hospitalizations, surgeries, and ER visits in previous 12 months . Vitals . Screenings to include cognitive, depression, and falls . Referrals and appointments  In addition, I have reviewed and discussed with patient certain preventive protocols, quality metrics, and best practice recommendations. A written personalized care plan for preventive services as well as general preventive health recommendations were provided to patient.   Due to this being a virtual/telephonic visit, the after visit summary with patients personalized plan was offered to patient via office or my-chart. Patient preferred to pick up at office at next visit or via mychart.  Andrez Grime, LPN   [id number]

## 2020-07-21 NOTE — Patient Instructions (Signed)
Rachel Bowers , Thank you for taking time to come for your Medicare Wellness Visit. I appreciate your ongoing commitment to your health goals. Please review the following plan we discussed and let me know if I can assist you in the future.   Screening recommendations/referrals: Colonoscopy: no longer required  Mammogram: Up to date, completed 04/05/2020, due 03/2021 Bone Density: Up to date, completed 05/19/2019, due 05/2021 Recommended yearly ophthalmology/optometry visit for glaucoma screening and checkup Recommended yearly dental visit for hygiene and checkup  Vaccinations: Influenza vaccine: Up to date, completed 01/09/2020, due 11/2020 Pneumococcal vaccine: Completed series Tdap vaccine: Up to date, completed 02/17/2013, due 02/2023 Shingles vaccine: due, check with your insurance regarding coverage if interested    Covid-19:Completed series  Advanced directives: Please bring a copy of your POA (Power of [place]) and/or Living Will to your next appointment.   Conditions/risks identified: hypertension, hyperlipidemia   Next appointment: Follow up in one year for your annual wellness visit    Preventive Care 80 Years and Older, Female Preventive care refers to lifestyle choices and visits with your health care provider that can promote health and wellness. What does preventive care include?  A yearly physical exam. This is also called an annual well check.  Dental exams once or twice a year.  Routine eye exams. Ask your health care provider how often you should have your eyes checked.  Personal lifestyle choices, including:  Daily care of your teeth and gums.  Regular physical activity.  Eating a healthy diet.  Avoiding tobacco and drug use.  Limiting alcohol use.  Practicing safe sex.  Taking low-dose aspirin every day.  Taking vitamin and mineral supplements as recommended by your health care provider. What happens during an annual well check? The services and  screenings done by your health care provider during your annual well check will depend on your age, overall health, lifestyle risk factors, and family history of disease. Counseling  Your health care provider may ask you questions about your:  Alcohol use.  Tobacco use.  Drug use.  Emotional well-being.  Home and relationship well-being.  Sexual activity.  Eating habits.  History of falls.  Memory and ability to understand (cognition).  Work and work Statistician.  Reproductive health. Screening  You may have the following tests or measurements:  Height, weight, and BMI.  Blood pressure.  Lipid and cholesterol levels. These may be checked every 5 years, or more frequently if you are over 80 years old.  Skin check.  Lung cancer screening. You may have this screening every year starting at age 80 if you have a 30-pack-year history of smoking and currently smoke or have quit within the past 15 years.  Fecal occult blood test (FOBT) of the stool. You may have this test every year starting at age 80  Flexible sigmoidoscopy or colonoscopy. You may have a sigmoidoscopy every 5 years or a colonoscopy every 10 years starting at age 80  Hepatitis C blood test.  Hepatitis B blood test.  Sexually transmitted disease (STD) testing.  Diabetes screening. This is done by checking your blood sugar (glucose) after you have not eaten for a while (fasting). You may have this done every 1-3 years.  Bone density scan. This is done to screen for osteoporosis. You may have this done starting at age 80  Mammogram. This may be done every 1-2 years. Talk to your health care provider about how often you should have regular mammograms. Talk with your health care provider about  your test results, treatment options, and if necessary, the need for more tests. Vaccines  Your health care provider may recommend certain vaccines, such as:  Influenza vaccine. This is recommended every  year.  Tetanus, diphtheria, and acellular pertussis (Tdap, Td) vaccine. You may need a Td booster every 10 years.  Zoster vaccine. You may need this after age 49.  Pneumococcal 13-valent conjugate (PCV13) vaccine. One dose is recommended after age 80  Pneumococcal polysaccharide (PPSV23) vaccine. One dose is recommended after age 23. Talk to your health care provider about which screenings and vaccines you need and how often you need them. This information is not intended to replace advice given to you by your health care provider. Make sure you discuss any questions you have with your health care provider. Document Released: 04/22/2015 Document Revised: 12/14/2015 Document Reviewed: 01/25/2015 Elsevier Interactive Patient Education  2017 [place] Prevention in the Home Falls can cause injuries. They can happen to people of all ages. There are many things you can do to make your home safe and to help prevent falls. What can I do on the outside of my home?  Regularly fix the edges of walkways and driveways and fix any cracks.  Remove anything that might make you trip as you walk through a door, such as a raised step or threshold.  Trim any bushes or trees on the path to your home.  Use bright outdoor lighting.  Clear any walking paths of anything that might make someone trip, such as rocks or tools.  Regularly check to see if handrails are loose or broken. Make sure that both sides of any steps have handrails.  Any raised decks and porches should have guardrails on the edges.  Have any leaves, snow, or ice cleared regularly.  Use sand or salt on walking paths during winter.  Clean up any spills in your garage right away. This includes oil or grease spills. What can I do in the bathroom?  Use night lights.  Install grab bars by the toilet and in the tub and shower. Do not use towel bars as grab bars.  Use non-skid mats or decals in the tub or shower.  If you  need to sit down in the shower, use a plastic, non-slip stool.  Keep the floor dry. Clean up any water that spills on the floor as soon as it happens.  Remove soap buildup in the tub or shower regularly.  Attach bath mats securely with double-sided non-slip rug tape.  Do not have throw rugs and other things on the floor that can make you trip. What can I do in the bedroom?  Use night lights.  Make sure that you have a light by your bed that is easy to reach.  Do not use any sheets or blankets that are too big for your bed. They should not hang down onto the floor.  Have a firm chair that has side arms. You can use this for support while you get dressed.  Do not have throw rugs and other things on the floor that can make you trip. What can I do in the kitchen?  Clean up any spills right away.  Avoid walking on wet floors.  Keep items that you use a lot in easy-to-reach places.  If you need to reach something above you, use a strong step stool that has a grab bar.  Keep electrical cords out of the way.  Do not use floor polish or wax  that makes floors slippery. If you must use wax, use non-skid floor wax.  Do not have throw rugs and other things on the floor that can make you trip. What can I do with my stairs?  Do not leave any items on the stairs.  Make sure that there are handrails on both sides of the stairs and use them. Fix handrails that are broken or loose. Make sure that handrails are as long as the stairways.  Check any carpeting to make sure that it is firmly attached to the stairs. Fix any carpet that is loose or worn.  Avoid having throw rugs at the top or bottom of the stairs. If you do have throw rugs, attach them to the floor with carpet tape.  Make sure that you have a light switch at the top of the stairs and the bottom of the stairs. If you do not have them, ask someone to add them for you. What else can I do to help prevent falls?  Wear shoes  that:  Do not have high heels.  Have rubber bottoms.  Are comfortable and fit you well.  Are closed at the toe. Do not wear sandals.  If you use a stepladder:  Make sure that it is fully opened. Do not climb a closed stepladder.  Make sure that both sides of the stepladder are locked into place.  Ask someone to hold it for you, if possible.  Clearly mark and make sure that you can see:  Any grab bars or handrails.  First and last steps.  Where the edge of each step is.  Use tools that help you move around (mobility aids) if they are needed. These include:  Canes.  Walkers.  Scooters.  Crutches.  Turn on the lights when you go into a dark area. Replace any light bulbs as soon as they burn out.  Set up your furniture so you have a clear path. Avoid moving your furniture around.  If any of your floors are uneven, fix them.  If there are any pets around you, be aware of where they are.  Review your medicines with your doctor. Some medicines can make you feel dizzy. This can increase your chance of falling. Ask your doctor what other things that you can do to help prevent falls. This information is not intended to replace advice given to you by your health care provider. Make sure you discuss any questions you have with your health care provider. Document Released: 01/20/2009 Document Revised: 09/01/2015 Document Reviewed: 04/30/2014 Elsevier Interactive Patient Education  2017 [employer].

## 2020-07-21 NOTE — Progress Notes (Signed)
PCP notes:  Health Maintenance: No gaps noted    Abnormal Screenings: none   Patient concerns: none   Nurse concerns: none   Next PCP appt: none 

## 2020-07-28 ENCOUNTER — Other Ambulatory Visit: Payer: Self-pay | Admitting: Primary Care

## 2020-07-28 DIAGNOSIS — I10 Essential (primary) hypertension: Secondary | ICD-10-CM

## 2020-10-13 DIAGNOSIS — L820 Inflamed seborrheic keratosis: Secondary | ICD-10-CM | POA: Diagnosis not present

## 2020-10-13 DIAGNOSIS — D225 Melanocytic nevi of trunk: Secondary | ICD-10-CM | POA: Diagnosis not present

## 2020-10-13 DIAGNOSIS — D1801 Hemangioma of skin and subcutaneous tissue: Secondary | ICD-10-CM | POA: Diagnosis not present

## 2020-10-13 DIAGNOSIS — Z85828 Personal history of other malignant neoplasm of skin: Secondary | ICD-10-CM | POA: Diagnosis not present

## 2020-10-13 DIAGNOSIS — L905 Scar conditions and fibrosis of skin: Secondary | ICD-10-CM | POA: Diagnosis not present

## 2020-10-13 DIAGNOSIS — L821 Other seborrheic keratosis: Secondary | ICD-10-CM | POA: Diagnosis not present

## 2021-02-03 ENCOUNTER — Other Ambulatory Visit: Payer: Self-pay | Admitting: Primary Care

## 2021-02-03 DIAGNOSIS — E785 Hyperlipidemia, unspecified: Secondary | ICD-10-CM

## 2021-04-14 DIAGNOSIS — Z1231 Encounter for screening mammogram for malignant neoplasm of breast: Secondary | ICD-10-CM | POA: Diagnosis not present

## 2021-04-14 LAB — HM MAMMOGRAPHY

## 2021-04-18 ENCOUNTER — Encounter: Payer: Self-pay | Admitting: Primary Care

## 2021-04-26 ENCOUNTER — Other Ambulatory Visit: Payer: Self-pay | Admitting: Primary Care

## 2021-04-26 DIAGNOSIS — E785 Hyperlipidemia, unspecified: Secondary | ICD-10-CM

## 2021-04-29 ENCOUNTER — Other Ambulatory Visit: Payer: Self-pay | Admitting: Primary Care

## 2021-04-29 DIAGNOSIS — I10 Essential (primary) hypertension: Secondary | ICD-10-CM

## 2021-05-12 ENCOUNTER — Other Ambulatory Visit: Payer: Self-pay | Admitting: Primary Care

## 2021-05-12 DIAGNOSIS — I10 Essential (primary) hypertension: Secondary | ICD-10-CM

## 2021-05-17 DIAGNOSIS — Z780 Asymptomatic menopausal state: Secondary | ICD-10-CM | POA: Diagnosis not present

## 2021-05-17 DIAGNOSIS — M85852 Other specified disorders of bone density and structure, left thigh: Secondary | ICD-10-CM | POA: Diagnosis not present

## 2021-05-17 DIAGNOSIS — M85851 Other specified disorders of bone density and structure, right thigh: Secondary | ICD-10-CM | POA: Diagnosis not present

## 2021-05-17 LAB — HM DEXA SCAN

## 2021-05-18 ENCOUNTER — Encounter: Payer: Self-pay | Admitting: Primary Care

## 2021-06-01 DIAGNOSIS — H0288A Meibomian gland dysfunction right eye, upper and lower eyelids: Secondary | ICD-10-CM | POA: Diagnosis not present

## 2021-06-01 DIAGNOSIS — D3132 Benign neoplasm of left choroid: Secondary | ICD-10-CM | POA: Diagnosis not present

## 2021-06-01 DIAGNOSIS — H35372 Puckering of macula, left eye: Secondary | ICD-10-CM | POA: Diagnosis not present

## 2021-06-01 DIAGNOSIS — H43811 Vitreous degeneration, right eye: Secondary | ICD-10-CM | POA: Diagnosis not present

## 2021-06-01 DIAGNOSIS — H04123 Dry eye syndrome of bilateral lacrimal glands: Secondary | ICD-10-CM | POA: Diagnosis not present

## 2021-06-01 DIAGNOSIS — H1045 Other chronic allergic conjunctivitis: Secondary | ICD-10-CM | POA: Diagnosis not present

## 2021-06-01 DIAGNOSIS — H0288B Meibomian gland dysfunction left eye, upper and lower eyelids: Secondary | ICD-10-CM | POA: Diagnosis not present

## 2021-06-01 DIAGNOSIS — H43393 Other vitreous opacities, bilateral: Secondary | ICD-10-CM | POA: Diagnosis not present

## 2021-06-01 DIAGNOSIS — H2513 Age-related nuclear cataract, bilateral: Secondary | ICD-10-CM | POA: Diagnosis not present

## 2021-06-07 ENCOUNTER — Encounter: Payer: Medicare Other | Admitting: Primary Care

## 2021-06-14 ENCOUNTER — Ambulatory Visit (INDEPENDENT_AMBULATORY_CARE_PROVIDER_SITE_OTHER): Payer: Medicare Other | Admitting: Primary Care

## 2021-06-14 ENCOUNTER — Encounter: Payer: Self-pay | Admitting: Primary Care

## 2021-06-14 ENCOUNTER — Other Ambulatory Visit: Payer: Self-pay

## 2021-06-14 ENCOUNTER — Telehealth: Payer: Self-pay | Admitting: Radiology

## 2021-06-14 VITALS — BP 118/62 | HR 70 | Temp 98.6°F | Ht 66.0 in | Wt 149.0 lb

## 2021-06-14 DIAGNOSIS — E559 Vitamin D deficiency, unspecified: Secondary | ICD-10-CM | POA: Diagnosis not present

## 2021-06-14 DIAGNOSIS — M5442 Lumbago with sciatica, left side: Secondary | ICD-10-CM

## 2021-06-14 DIAGNOSIS — E785 Hyperlipidemia, unspecified: Secondary | ICD-10-CM | POA: Diagnosis not present

## 2021-06-14 DIAGNOSIS — M542 Cervicalgia: Secondary | ICD-10-CM | POA: Diagnosis not present

## 2021-06-14 DIAGNOSIS — R7303 Prediabetes: Secondary | ICD-10-CM | POA: Diagnosis not present

## 2021-06-14 DIAGNOSIS — I10 Essential (primary) hypertension: Secondary | ICD-10-CM | POA: Diagnosis not present

## 2021-06-14 DIAGNOSIS — Z0000 Encounter for general adult medical examination without abnormal findings: Secondary | ICD-10-CM | POA: Diagnosis not present

## 2021-06-14 DIAGNOSIS — M159 Polyosteoarthritis, unspecified: Secondary | ICD-10-CM | POA: Diagnosis not present

## 2021-06-14 DIAGNOSIS — G8929 Other chronic pain: Secondary | ICD-10-CM | POA: Diagnosis not present

## 2021-06-14 LAB — LIPID PANEL
Cholesterol: 170 mg/dL (ref 0–200)
HDL: 77.1 mg/dL (ref >39.00)
LDL Cholesterol: 78 mg/dL (ref 0–99)
NonHDL: 92.94
Total CHOL/HDL Ratio: 2
Triglycerides: 77 mg/dL (ref 0.0–149.0)
VLDL: 15.4 mg/dL (ref 0.0–40.0)

## 2021-06-14 LAB — CBC
HCT: 38.7 % (ref 36.0–46.0)
Hemoglobin: 13.1 g/dL (ref 12.0–15.0)
MCHC: 33.9 g/dL (ref 30.0–36.0)
MCV: 90.6 fl (ref 78.0–100.0)
Platelets: 308 10*3/uL (ref 150.0–400.0)
RBC: 4.27 Mil/uL (ref 3.87–5.11)
RDW: 13 % (ref 11.5–15.5)
WBC: 5.1 10*3/uL (ref 4.0–10.5)

## 2021-06-14 LAB — COMPREHENSIVE METABOLIC PANEL
ALT: 19 U/L (ref 0–35)
AST: 16 U/L (ref 0–37)
Albumin: 4.6 g/dL (ref 3.5–5.2)
Alkaline Phosphatase: 63 U/L (ref 39–117)
BUN: 19 mg/dL (ref 6–23)
CO2: 30 mEq/L (ref 19–32)
Calcium: 10 mg/dL (ref 8.4–10.5)
Chloride: 103 mEq/L (ref 96–112)
Creatinine, Ser: 0.83 mg/dL (ref 0.40–1.20)
GFR: 66.56 mL/min (ref >60.00)
Glucose, Bld: 101 mg/dL — ABNORMAL HIGH (ref 70–99)
Potassium: 4.2 mEq/L (ref 3.5–5.1)
Sodium: 141 mEq/L (ref 135–145)
Total Bilirubin: 0.8 mg/dL (ref 0.2–1.2)
Total Protein: 7 g/dL (ref 6.0–8.3)

## 2021-06-14 LAB — HEMOGLOBIN A1C: Hgb A1c MFr Bld: 6.2 % (ref 4.6–6.5)

## 2021-06-14 LAB — VITAMIN D 25 HYDROXY (VIT D DEFICIENCY, FRACTURES): VITD: >120 ng/mL

## 2021-06-14 MED ORDER — MELOXICAM 15 MG PO TABS: 15.0000 mg | ORAL_TABLET | Freq: Every day | ORAL | 0 refills | Status: DC

## 2021-06-14 MED ORDER — TIZANIDINE HCL 4 MG PO TABS: 4.0000 mg | ORAL_TABLET | Freq: Every evening | ORAL | 0 refills | Status: DC | PRN

## 2021-06-14 NOTE — Assessment & Plan Note (Signed)
Chronic, overall stable, waxes and wanes. ? ?Continue tizanidine 4 mg PRN and Meloxicam 15 mg PRN. ?Refills provided.  ?

## 2021-06-14 NOTE — Telephone Encounter (Signed)
[facility] lab called a critical vit D - greater than 120. Results given to Allie Bossier ?

## 2021-06-14 NOTE — Assessment & Plan Note (Signed)
Immunizations UTD. ?Mammogram and bone density scan UTD. ?Colonoscopy UTD, no further imaging needed. ? ?Discussed the importance of a healthy diet and regular exercise in order for weight loss, and to reduce the risk of further co-morbidity. ? ?Exam today stable. ?Labs pending. ?

## 2021-06-14 NOTE — Assessment & Plan Note (Signed)
Chronic, waxes and wanes. ?Diclofenac Gel ineffective ?Using Ibuprofen PRN with improvement.  ? ?Continue Tizanidine 4 mg PRN and Meloxicam 15 mg PRN.  ?

## 2021-06-14 NOTE — Assessment & Plan Note (Signed)
Repeat A1C pending. 

## 2021-06-14 NOTE — Assessment & Plan Note (Signed)
Repeat lipid panel pending. ?Continue lovastatin 20 mg daily.  ?

## 2021-06-14 NOTE — Assessment & Plan Note (Signed)
Controlled.  ? ?Continue amlodipine 5 mg daily, irbesartan 150 mg daily.  ?CMP pending.  ?

## 2021-06-14 NOTE — Telephone Encounter (Signed)
Please notify patient that her vitamin D level is too high. ?I see that she takes 10,000 IUs daily of vitamin D daily, have her reduce to 3 days weekly. ?

## 2021-06-14 NOTE — Patient Instructions (Addendum)
Stop by the lab prior to leaving today. I will notify you of your results once received.  ° °It was a pleasure to see you today! ° °Preventive Care 65 Years and Older, Female °Preventive care refers to lifestyle choices and visits with your health care provider that can promote health and wellness. Preventive care visits are also called wellness exams. °What can I expect for my preventive care visit? °Counseling °Your health care provider may ask you questions about your: °Medical history, including: °Past medical problems. °Family medical history. °Pregnancy and menstrual history. °History of falls. °Current health, including: °Memory and ability to understand (cognition). °Emotional well-being. °Home life and relationship well-being. °Sexual activity and sexual health. °Lifestyle, including: °Alcohol, nicotine or tobacco, and drug use. °Access to firearms. °Diet, exercise, and sleep habits. °Work and work environment. °Sunscreen use. °Safety issues such as seatbelt and bike helmet use. °Physical exam °Your health care provider will check your: °Height and weight. These may be used to calculate your BMI (body mass index). BMI is a measurement that tells if you are at a healthy weight. °Waist circumference. This measures the distance around your waistline. This measurement also tells if you are at a healthy weight and may help predict your risk of certain diseases, such as type 2 diabetes and high blood pressure. °Heart rate and blood pressure. °Body temperature. °Skin for abnormal spots. °What immunizations do I need? °Vaccines are usually given at various ages, according to a schedule. Your health care provider will recommend vaccines for you based on your age, medical history, and lifestyle or other factors, such as travel or where you work. °What tests do I need? °Screening °Your health care provider may recommend screening tests for certain conditions. This may include: °Lipid and cholesterol levels. °Hepatitis  C test. °Hepatitis B test. °HIV (human immunodeficiency virus) test. °STI (sexually transmitted infection) testing, if you are at risk. °Lung cancer screening. °Colorectal cancer screening. °Diabetes screening. This is done by checking your blood sugar (glucose) after you have not eaten for a while (fasting). °Mammogram. Talk with your health care provider about how often you should have regular mammograms. °BRCA-related cancer screening. This may be done if you have a family history of breast, ovarian, tubal, or peritoneal cancers. °Bone density scan. This is done to screen for osteoporosis. °Talk with your health care provider about your test results, treatment options, and if necessary, the need for more tests. °Follow these instructions at home: °Eating and drinking ° °Eat a diet that includes fresh fruits and vegetables, whole grains, lean protein, and low-fat dairy products. Limit your intake of foods with high amounts of sugar, saturated fats, and salt. °Take vitamin and mineral supplements as recommended by your health care provider. °Do not drink alcohol if your health care provider tells you not to drink. °If you drink alcohol: °Limit how much you have to 0-1 drink a day. °Know how much alcohol is in your drink. In the [place] one drink equals one 12 oz bottle of beer (355 mL), one 5 oz glass of wine (148 mL), or one 1½ oz glass of hard liquor (44 mL). °Lifestyle °Brush your teeth every morning and night with fluoride toothpaste. Floss one time each day. °Exercise for at least 30 minutes 5 or more days each week. °Do not use any products that contain nicotine or tobacco. These products include cigarettes, chewing tobacco, and vaping devices, such as e-cigarettes. If you need help quitting, ask your health care provider. °Do not use drugs. °  If you are sexually active, practice safe sex. Use a condom or other form of protection in order to prevent STIs. °Take aspirin only as told by your health care provider.  Make sure that you understand how much to take and what form to take. Work with your health care provider to find out whether it is safe and beneficial for you to take aspirin daily. °Ask your health care provider if you need to take a cholesterol-lowering medicine (statin). °Find healthy ways to manage stress, such as: °Meditation, yoga, or listening to music. °Journaling. °Talking to a trusted person. °Spending time with friends and family. °Minimize exposure to UV radiation to reduce your risk of skin cancer. °Safety °Always wear your seat belt while driving or riding in a vehicle. °Do not drive: °If you have been drinking alcohol. Do not ride with someone who has been drinking. °When you are tired or distracted. °While texting. °If you have been using any mind-altering substances or drugs. °Wear a helmet and other protective equipment during sports activities. °If you have firearms in your house, make sure you follow all gun safety procedures. °What's next? °Visit your health care provider once a year for an annual wellness visit. °Ask your health care provider how often you should have your eyes and teeth checked. °Stay up to date on all vaccines. °This information is not intended to replace advice given to you by your health care provider. Make sure you discuss any questions you have with your health care provider. °Document Revised: 09/21/2020 Document Reviewed: 09/21/2020 °Elsevier Patient Education © 2022 [employer] ° °

## 2021-06-14 NOTE — Progress Notes (Signed)
? ?Subjective:  ? ? Patient ID: Rachel Bowers, female    DOB: 10-06-1940, 81 y.o.   MRN: [id number] ? ?HPI ? ?Rachel Bowers is a very pleasant 81 y.o. female who presents today for complete physical and follow up of chronic conditions. ? ?She has been under a lot of stress since the loss of her husband in the Fall 2022.  ? ?She continues to struggle with chronic arthritis which is located to the back, neck, hands. She is mostly concerned about her chronic pain and swelling  ? ?Immunizations: ?-Tetanus: 2014 ?-Influenza:  ?-Covid-19: 3 vaccines ?-Shingles: Completed Zostavax ?-Pneumonia: Prevnar 13 in 2015, Pneumovax in 2009 ? ?Diet: Fair diet.  ?Exercise: No regular exercise. ? ?Eye exam: Completes annually  ?Dental exam: Completes semi-annually  ? ?Mammogram: Completed in January 2023 ?Dexa: Completed in February 2023 ?Colonoscopy: Completed in 2016, no further procedures needed given age. ? ?BP Readings from Last 3 Encounters:  ?06/14/21 118/62  ?06/02/20 118/72  ?11/26/19 138/79  ? ? ? ? ?Review of Systems  ?Constitutional:  Negative for unexpected weight change.  ?HENT:  Negative for rhinorrhea.   ?Respiratory:  Negative for cough and shortness of breath.   ?Cardiovascular:  Negative for chest pain.  ?Gastrointestinal:  Negative for constipation and diarrhea.  ?Genitourinary:  Negative for difficulty urinating.  ?Musculoskeletal:  Positive for arthralgias.  ?Skin:  Negative for rash.  ?Allergic/Immunologic: Negative for environmental allergies.  ?Neurological:  Negative for dizziness and headaches.  ?Psychiatric/Behavioral:  The patient is not nervous/anxious.   ? ?   ? ? ?Past Medical History:  ?Diagnosis Date  ? Arthritis   ? Carotid artery occlusion   ? 40-49%  ? Diverticulosis   ? per colonoscopy  ? Heart murmur   ? Hyperlipidemia   ? Hypertension   ? ? ?Social History  ? ?Socioeconomic History  ? Marital status: Married  ?  Spouse name: Not on file  ? Number of children: Not on file  ? Years of education:  Not on file  ? Highest education level: Not on file  ?Occupational History  ? Occupation: retired  ?Tobacco Use  ? Smoking status: Former  ?  Types: Cigarettes  ?  Quit date: 01/25/1974  ?  Years since quitting: 47.4  ? Smokeless tobacco: Never  ?Vaping Use  ? Vaping Use: Never used  ?Substance and Sexual Activity  ? Alcohol use: Yes  ?  Alcohol/week: 1.0 standard drink  ?  Types: 1 Glasses of wine per week  ? Drug use: No  ? Sexual activity: Not on file  ?Other Topics Concern  ? Not on file  ?Social History Narrative  ? Married.  ? 1 daughter and 1 son. 2 grandchildren.  ? 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  ? Enjoys exercising at the [facility], reading.   ? ?Social Determinants of Health  ? ?Financial Resource Strain: Low Risk   ? Difficulty of Paying Living Expenses: Not hard at all  ?Food Insecurity: No Food Insecurity  ? Worried About Charity fundraiser in the Last Year: Never true  ? Ran Out of Food in the Last Year: Never true  ?Transportation Needs: No Transportation Needs  ? Lack of Transportation (Medical): No  ? Lack of Transportation (Non-Medical): No  ?Physical Activity: Inactive  ? Days of Exercise per Week: 0 days  ? Minutes of Exercise per Session: 0 min  ?Stress: No Stress Concern Present  ? Feeling  of Stress : Not at all  ?Social Connections: Not on file  ?Intimate Partner Violence: Not At Risk  ? Fear of Current or Ex-Partner: No  ? Emotionally Abused: No  ? Physically Abused: No  ? Sexually Abused: No  ? ? ?Past Surgical History:  ?Procedure Laterality Date  ? ABDOMINAL HYSTERECTOMY  1982  ? APPENDECTOMY  2008  ? ? ?Family History  ?Problem Relation Age of Onset  ? Heart disease Mother   ? Hyperlipidemia Mother   ? Hypertension Mother   ? Heart attack Mother   ? Lung cancer Brother   ? ? ?Allergies  ?Allergen Reactions  ? Penicillins Hives  ? ? ?Current Outpatient Medications on File Prior to Visit  ?Medication Sig Dispense Refill  ? amLODipine  (NORVASC) 5 MG tablet Take 1 tablet (5 mg total) by mouth daily. For blood pressure. Office visit required for further refills. 30 tablet 0  ? aspirin 81 MG tablet Take 81 mg by mouth daily.    ? Calcium Carbonate-Vitamin D (CALCIUM-VITAMIN D3 PO) Take 1,000 mg by mouth daily.    ? Cholecalciferol (VITAMIN D3) 10000 units TABS Take by mouth.    ? diclofenac Sodium (VOLTAREN) 1 % GEL Apply 2 g topically 3 (three) times daily as needed. 100 g 0  ? irbesartan (AVAPRO) 150 MG tablet TAKE 1 TABLET BY MOUTH ONCE DAILY FOR BLOOD PRESSURE. Office visit required for further refills. 90 tablet 0  ? loratadine (CLARITIN) 10 MG tablet Take 10 mg by mouth daily.    ? lovastatin (MEVACOR) 20 MG tablet Take 1 tablet (20 mg total) by mouth at bedtime. For cholesterol. Office visit required for further refills. 90 tablet 0  ? Multiple Vitamin (MULTIVITAMIN) tablet Take 1 tablet by mouth daily.    ? Omega-3 Fatty Acids (FISH OIL) 1000 MG CAPS Take 1 capsule by mouth daily.    ? ?No current facility-administered medications on file prior to visit.  ? ? ?BP 118/62   Pulse 70   Temp 98.6 ?F (37 ?C) (Oral)   Ht '5\' 6"'$  (1.676 m)   Wt 149 lb (67.6 kg)   SpO2 97%   BMI 24.05 kg/m?  ?Objective:  ? Physical Exam ?HENT:  ?   Right Ear: Tympanic membrane and ear canal normal.  ?   Left Ear: Tympanic membrane and ear canal normal.  ?   Nose: Nose normal.  ?Eyes:  ?   Conjunctiva/sclera: Conjunctivae normal.  ?   Pupils: Pupils are equal, round, and reactive to light.  ?Neck:  ?   Thyroid: No thyromegaly.  ?Cardiovascular:  ?   Rate and Rhythm: Normal rate and regular rhythm.  ?   Heart sounds: No murmur heard. ?Pulmonary:  ?   Effort: Pulmonary effort is normal.  ?   Breath sounds: Normal breath sounds. No rales.  ?Abdominal:  ?   General: Bowel sounds are normal.  ?   Palpations: Abdomen is soft.  ?   Tenderness: There is no abdominal tenderness.  ?Musculoskeletal:  ?   Right hand: Deformity present. Decreased range of motion. Normal  strength.  ?   Left hand: Deformity present. Decreased range of motion. Normal strength.  ?   Cervical back: Neck supple.  ?   Comments: Chronic joint swelling to fingers of both hands  ?Lymphadenopathy:  ?   Cervical: No cervical adenopathy.  ?Skin: ?   General: Skin is warm and dry.  ?   Findings: No rash.  ?Neurological:  ?  Mental Status: She is alert and oriented to person, place, and time.  ?   Cranial Nerves: No cranial nerve deficit.  ?   Deep Tendon Reflexes: Reflexes are normal and symmetric.  ? ? ? ? ? ?   ?Assessment & Plan:  ? ? ? ? ?This visit occurred during the SARS-CoV-2 public health emergency.  Safety protocols were in place, including screening questions prior to the visit, additional usage of staff PPE, and extensive cleaning of exam room while observing appropriate contact time as indicated for disinfecting solutions.  ?

## 2021-06-15 NOTE — Telephone Encounter (Signed)
Called patient states she is only taking 5,000IU daily. She will decrease to three times a week. And call if any further questions. She also gave dates for immunizations and I have updated in chart.  ?

## 2021-06-15 NOTE — Telephone Encounter (Signed)
Noted. ?We will change the vitamin D on her medication list. ?

## 2021-06-16 DIAGNOSIS — Z08 Encounter for follow-up examination after completed treatment for malignant neoplasm: Secondary | ICD-10-CM | POA: Diagnosis not present

## 2021-06-16 DIAGNOSIS — L821 Other seborrheic keratosis: Secondary | ICD-10-CM | POA: Diagnosis not present

## 2021-06-16 DIAGNOSIS — L814 Other melanin hyperpigmentation: Secondary | ICD-10-CM | POA: Diagnosis not present

## 2021-06-16 DIAGNOSIS — D2371 Other benign neoplasm of skin of right lower limb, including hip: Secondary | ICD-10-CM | POA: Diagnosis not present

## 2021-06-16 DIAGNOSIS — Z85828 Personal history of other malignant neoplasm of skin: Secondary | ICD-10-CM | POA: Diagnosis not present

## 2021-06-25 ENCOUNTER — Other Ambulatory Visit: Payer: Self-pay | Admitting: Primary Care

## 2021-06-25 DIAGNOSIS — I10 Essential (primary) hypertension: Secondary | ICD-10-CM

## 2021-07-06 ENCOUNTER — Ambulatory Visit: Payer: Medicare Other | Admitting: Primary Care

## 2021-07-19 ENCOUNTER — Other Ambulatory Visit: Payer: Self-pay | Admitting: Primary Care

## 2021-07-19 DIAGNOSIS — E785 Hyperlipidemia, unspecified: Secondary | ICD-10-CM

## 2021-07-22 ENCOUNTER — Other Ambulatory Visit: Payer: Self-pay | Admitting: Primary Care

## 2021-07-22 DIAGNOSIS — I10 Essential (primary) hypertension: Secondary | ICD-10-CM

## 2021-07-26 ENCOUNTER — Other Ambulatory Visit: Payer: Self-pay | Admitting: *Deleted

## 2021-07-26 DIAGNOSIS — I6529 Occlusion and stenosis of unspecified carotid artery: Secondary | ICD-10-CM

## 2021-07-31 ENCOUNTER — Ambulatory Visit (HOSPITAL_COMMUNITY)
Admission: RE | Admit: 2021-07-31 | Discharge: 2021-07-31 | Disposition: A | Discharge status: Home / Self Care | Payer: Medicare Other | Source: Ambulatory Visit | Attending: Surgery | Admitting: Surgery

## 2021-07-31 ENCOUNTER — Ambulatory Visit: Payer: Medicare Other | Admitting: Physician Assistant

## 2021-07-31 VITALS — BP 148/76 | HR 69 | Temp 98.0°F | Resp 20 | Ht 66.0 in | Wt 150.0 lb

## 2021-07-31 DIAGNOSIS — I6529 Occlusion and stenosis of unspecified carotid artery: Secondary | ICD-10-CM | POA: Diagnosis not present

## 2021-07-31 NOTE — Progress Notes (Signed)
?Office Note  ? ? ? ?CC:  follow up ?Requesting Provider:  Pleas Koch, NP ? ?HPI: Rachel Bowers is a 81 y.o. (1940/07/19) female who presents for surveillance of carotid artery stenosis.  She denies any diagnosis of CVA or TIA since last office visit.  She also denies any current strokelike symptoms including slurring speech, changes in vision, or one-sided weakness.  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  She lives by herself and performs all her activities of daily living.  Patient states she remains active during the day.  She denies tobacco use.  She is on aspirin and statin daily. ? ? ?Past Medical History:  ?Diagnosis Date  ? Arthritis   ? Carotid artery occlusion   ? 40-49%  ? Diverticulosis   ? per colonoscopy  ? Heart murmur   ? Hyperlipidemia   ? Hypertension   ? ? ?Past Surgical History:  ?Procedure Laterality Date  ? ABDOMINAL HYSTERECTOMY  1982  ? APPENDECTOMY  2008  ? ? ?Social History  ? ?Socioeconomic History  ? Marital status: Married  ?  Spouse name: Not on file  ? Number of children: Not on file  ? Years of education: Not on file  ? Highest education level: Not on file  ?Occupational History  ? Occupation: retired  ?Tobacco Use  ? Smoking status: Former  ?  Types: Cigarettes  ?  Quit date: 01/25/1974  ?  Years since quitting: 47.5  ?  Passive exposure: Never  ? Smokeless tobacco: Never  ?Vaping Use  ? Vaping Use: Never used  ?Substance and Sexual Activity  ? Alcohol use: Yes  ?  Alcohol/week: 1.0 standard drink  ?  Types: 1 Glasses of wine per week  ? Drug use: No  ? Sexual activity: Not on file  ?Other Topics Concern  ? Not on file  ?Social History Narrative  ? Married.  ? 1 daughter and 1 son. 2 grandchildren.  ? 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  ? Enjoys exercising at the [facility], reading.   ? ?Social Determinants of Health  ? ?Financial Resource  Strain: Not on file  ?Food Insecurity: Not on file  ?Transportation Needs: Not on file  ?Physical Activity: Not on file  ?Stress: Not on file  ?Social Connections: Not on file  ?Intimate Partner Violence: Not on file  ? ? ?Family History  ?Problem Relation Age of Onset  ? Heart disease Mother   ? Hyperlipidemia Mother   ? Hypertension Mother   ? Heart attack Mother   ? Lung cancer Brother   ? ? ?Current Outpatient Medications  ?Medication Sig Dispense Refill  ? amLODipine (NORVASC) 5 MG tablet Take 1 tablet (5 mg total) by mouth daily. for blood pressure. 90 tablet 3  ? aspirin 81 MG tablet Take 81 mg by mouth daily.    ? Calcium Carbonate-Vitamin D (CALCIUM-VITAMIN D3 PO) Take 1,000 mg by mouth daily.    ? Cholecalciferol (VITAMIN D3) 125 MCG (5000 UT) CAPS Take 1 capsule by mouth 3 (three) times a week.    ? diclofenac Sodium (VOLTAREN) 1 % GEL Apply 2 g topically 3 (three) times daily as needed. 100 g 0  ? irbesartan (AVAPRO) 150 MG tablet TAKE 1 TABLET BY MOUTH ONCE DAILY FOR BLOOD PRESSURE. 90 tablet 3  ? loratadine (CLARITIN) 10 MG tablet  Take 10 mg by mouth daily.    ? lovastatin (MEVACOR) 20 MG tablet TAKE 1 TABLET BY MOUTH AT  BEDTIME FOR CHOLESTEROL 90 tablet 3  ? meloxicam (MOBIC) 15 MG tablet Take 1 tablet (15 mg total) by mouth daily. As needed for pain. 30 tablet 0  ? Multiple Vitamin (MULTIVITAMIN) tablet Take 1 tablet by mouth daily.    ? Omega-3 Fatty Acids (FISH OIL) 1000 MG CAPS Take 1 capsule by mouth daily.    ? tiZANidine (ZANAFLEX) 4 MG tablet Take 1 tablet (4 mg total) by mouth at bedtime as needed for muscle spasms. 30 tablet 0  ? ?No current facility-administered medications for this visit.  ? ? ?Allergies  ?Allergen Reactions  ? Penicillins Hives  ? ? ? ?REVIEW OF SYSTEMS:  ? ?'[X]'$  denotes positive finding, '[ ]'$  denotes negative finding ?Cardiac  Comments:  ?Chest pain or chest pressure:    ?Shortness of breath upon exertion:    ?Short of breath when lying flat:    ?Irregular heart rhythm:     ?    ?Vascular    ?Pain in calf, thigh, or hip brought on by ambulation:    ?Pain in feet at night that wakes you up from your sleep:     ?Blood clot in your veins:    ?Leg swelling:     ?    ?Pulmonary    ?Oxygen at home:    ?Productive cough:     ?Wheezing:     ?    ?Neurologic    ?Sudden weakness in arms or legs:     ?Sudden numbness in arms or legs:     ?Sudden onset of difficulty speaking or slurred speech:    ?Temporary loss of vision in one eye:     ?Problems with dizziness:     ?    ?Gastrointestinal    ?Blood in stool:     ?Vomited blood:     ?    ?Genitourinary    ?Burning when urinating:     ?Blood in urine:    ?    ?Psychiatric    ?Major depression:     ?    ?Hematologic    ?Bleeding problems:    ?Problems with blood clotting too easily:    ?    ?Skin    ?Rashes or ulcers:    ?    ?Constitutional    ?Fever or chills:    ? ? ?PHYSICAL EXAMINATION: ? ?Vitals:  ? 07/31/21 1239 07/31/21 1240  ?BP: (!) 143/77 (!) 148/76  ?Pulse: 69   ?Resp: 20   ?Temp: 98 ?F (36.7 ?C)   ?TempSrc: Temporal   ?SpO2: 98%   ?Weight: 150 lb (68 kg)   ?Height: '5\' 6"'$  (1.676 m)   ? ? ?General:  WDWN in NAD; vital signs documented above ?Gait: Not observed ?HENT: WNL, normocephalic ?Pulmonary: normal non-labored breathing , without Rales, rhonchi,  wheezing ?Cardiac: regular HR ?Abdomen: soft, NT, no masses ?Skin: without rashes ?Vascular Exam/Pulses: ? Right Left  ?Radial 2+ (normal) 2+ (normal)  ? ?Extremities: without ischemic changes, without Gangrene , without cellulitis; without open wounds;  ?Musculoskeletal: no muscle wasting or atrophy  ?Neurologic: A&O X 3; cranial nerves grossly intact ?Psychiatric:  The pt has Normal affect. ? ? ?Non-Invasive Vascular Imaging:   ?Right ICA 1 to 39% stenosis ?Left ICA 60 to 79% stenosis ? ? ? ?ASSESSMENT/PLAN:: 81 y.o. female here for follow up for surveillance of carotid artery stenosis ? ?-  Subjectively without any strokelike symptoms or neurological events since last office visit ?-Right  ICA 1 to 39% stenosis.  Left ICA increased to 60 to 79% stenosis.  Left ICA stenosis remains asymptomatic.  No indication for revascularization currently. ?-Continue aspirin and statin daily. ?-Recheck carotid duplex in 6 months per protocol ? ? ?Dagoberto Ligas, PA-C ?[facility] ?[phone] ? ?Clinic MD:   Trula Slade ? ?

## 2021-08-08 ENCOUNTER — Other Ambulatory Visit: Payer: Self-pay | Admitting: *Deleted

## 2021-08-08 DIAGNOSIS — I6529 Occlusion and stenosis of unspecified carotid artery: Secondary | ICD-10-CM

## 2021-08-11 ENCOUNTER — Ambulatory Visit (INDEPENDENT_AMBULATORY_CARE_PROVIDER_SITE_OTHER): Payer: Medicare Other

## 2021-08-11 VITALS — Ht 67.0 in | Wt 148.0 lb

## 2021-08-11 DIAGNOSIS — Z0000 Encounter for general adult medical examination without abnormal findings: Secondary | ICD-10-CM | POA: Diagnosis not present

## 2021-08-11 NOTE — Progress Notes (Signed)
?I connected with Ingri Diemer today by telephone and verified that I am speaking with the correct person using two identifiers. ?Location patient: home ?Location provider: work ?Persons participating in the virtual visit: Paije, Goodhart LPN. ?  ?I discussed the limitations, risks, security and privacy concerns of performing an evaluation and management service by telephone and the availability of in person appointments. I also discussed with the patient that there may be a patient responsible charge related to this service. The patient expressed understanding and verbally consented to this telephonic visit.  ?  ?Interactive audio and video telecommunications were attempted between this provider and patient, however failed, due to patient having technical difficulties OR patient did not have access to video capability.  We continued and completed visit with audio only. ? ?  ? ?Vital signs may be patient reported or missing. ? ?Subjective:  ? Rachel Bowers is a 81 y.o. female who presents for Medicare Annual (Subsequent) preventive examination. ? ?Review of Systems    ? ?Cardiac Risk Factors include: advanced age (>39mn, >>87women);hypertension ? ?   ?Objective:  ?  ?Today's Vitals  ? 08/11/21 1512  ?Weight: 148 lb (67.1 kg)  ?Height: '5\' 7"'$  (1.702 m)  ? ?Body mass index is 23.18 kg/m?. ? ? ?  08/11/2021  ?  3:17 PM 07/21/2020  ?  9:46 AM 04/07/2019  ? 10:35 AM 03/28/2018  ? 11:07 AM 03/27/2017  ? 10:03 AM 01/31/2015  ?  1:25 PM 01/25/2014  ?  9:56 AM  ?Advanced Directives  ?Does Patient Have a Medical Advance Directive? Yes Yes Yes Yes Yes Yes Yes  ?Type of AParamedicof [place] will [place]Living will [place]Living will [place]Living will [place]Living will [place]Living will [place]Living will  ?Does patient want to make changes to medical  advance directive?      No - Patient declined   ?Copy of [place] Chart? No - copy requested No - copy requested No - copy requested No - copy requested No - copy requested No - copy requested No - copy requested  ? ? ?Current Medications (verified) ?Outpatient Encounter Medications as of 08/11/2021  ?Medication Sig  ? amLODipine (NORVASC) 5 MG tablet Take 1 tablet (5 mg total) by mouth daily. for blood pressure.  ? aspirin 81 MG tablet Take 81 mg by mouth daily.  ? Calcium Carbonate-Vitamin D (CALCIUM-VITAMIN D3 PO) Take 1,000 mg by mouth daily.  ? Cholecalciferol (VITAMIN D3) 125 MCG (5000 UT) CAPS Take 1 capsule by mouth 3 (three) times a week.  ? diclofenac Sodium (VOLTAREN) 1 % GEL Apply 2 g topically 3 (three) times daily as needed.  ? irbesartan (AVAPRO) 150 MG tablet TAKE 1 TABLET BY MOUTH ONCE DAILY FOR BLOOD PRESSURE.  ? loratadine (CLARITIN) 10 MG tablet Take 10 mg by mouth daily.  ? lovastatin (MEVACOR) 20 MG tablet TAKE 1 TABLET BY MOUTH AT  BEDTIME FOR CHOLESTEROL  ? meloxicam (MOBIC) 15 MG tablet Take 1 tablet (15 mg total) by mouth daily. As needed for pain.  ? Multiple Vitamin (MULTIVITAMIN) tablet Take 1 tablet by mouth daily.  ? Omega-3 Fatty Acids (FISH OIL) 1000 MG CAPS Take 1 capsule by mouth daily.  ? tiZANidine (ZANAFLEX) 4 MG tablet Take 1 tablet (4 mg total) by mouth at bedtime as needed for muscle spasms.  ? ?No facility-administered encounter medications on  file as of 08/11/2021.  ? ? ?Allergies (verified) ?Penicillins  ? ?History: ?Past Medical History:  ?Diagnosis Date  ? Arthritis   ? Carotid artery occlusion   ? 40-49%  ? Diverticulosis   ? per colonoscopy  ? Heart murmur   ? Hyperlipidemia   ? Hypertension   ? ?Past Surgical History:  ?Procedure Laterality Date  ? ABDOMINAL HYSTERECTOMY  1982  ? APPENDECTOMY  2008  ? ?Family History  ?Problem Relation Age of Onset  ? Heart disease Mother   ? Hyperlipidemia Mother   ? Hypertension Mother   ? Heart attack Mother   ?  Lung cancer Brother   ? ?Social History  ? ?Socioeconomic History  ? Marital status: Married  ?  Spouse name: Not on file  ? Number of children: Not on file  ? Years of education: Not on file  ? Highest education level: Not on file  ?Occupational History  ? Occupation: retired  ?Tobacco Use  ? Smoking status: Former  ?  Types: Cigarettes  ?  Quit date: 01/25/1974  ?  Years since quitting: 47.5  ?  Passive exposure: Never  ? Smokeless tobacco: Never  ?Vaping Use  ? Vaping Use: Never used  ?Substance and Sexual Activity  ? Alcohol use: Yes  ?  Alcohol/week: 1.0 standard drink  ?  Types: 1 Glasses of wine per week  ? Drug use: No  ? Sexual activity: Not on file  ?Other Topics Concern  ? Not on file  ?Social History Narrative  ? Married.  ? 1 daughter and 1 son. 2 grandchildren.  ? 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  ? Enjoys exercising at the [facility], reading.   ? ?Social Determinants of Health  ? ?Financial Resource Strain: Low Risk   ? Difficulty of Paying Living Expenses: Not hard at all  ?Food Insecurity: No Food Insecurity  ? Worried About Charity fundraiser in the Last Year: Never true  ? Ran Out of Food in the Last Year: Never true  ?Transportation Needs: No Transportation Needs  ? Lack of Transportation (Medical): No  ? Lack of Transportation (Non-Medical): No  ?Physical Activity: Inactive  ? Days of Exercise per Week: 0 days  ? Minutes of Exercise per Session: 0 min  ?Stress: No Stress Concern Present  ? Feeling of Stress : Not at all  ?Social Connections: Not on file  ? ? ?Tobacco Counseling ?Counseling given: Not Answered ? ? ?Clinical Intake: ? ?Pre-visit preparation completed: Yes ? ?Pain : No/denies pain ? ?  ? ?Nutritional Status: BMI of 19-24  Normal ?Nutritional Risks: None ?Diabetes: No ? ?How often do you need to have someone help you when you read instructions, pamphlets, or other written materials from your doctor or pharmacy?: 1 - Never ?What is the  last grade level you completed in school?: 12th grade ? ?Diabetic? no ? ?Interpreter Needed?: No ? ?Information entered by :: NAllen LPN ? ? ?Activities of Daily Living ? ?  08/11/2021  ?  3:19 PM  ?In your present state of health, do you have any difficulty performing the following activities:  ?Hearing? 0  ?Vision? 0  ?Difficulty concentrating or making decisions? 0  ?Walking or climbing stairs? 0  ?Dressing or bathing? 0  ?Doing errands, shopping? 0  ?Preparing Food and eating ? N  ?Using the Toilet? N  ?In the past six months, have you accidently leaked urine? N  ?Do you  have problems with loss of bowel control? N  ?Managing your Medications? N  ?Managing your Finances? N  ?Housekeeping or managing your Housekeeping? N  ? ? ?Patient Care Team: ?Pleas Koch, NP as PCP - General (Internal Medicine) ? ?Indicate any recent Medical Services you may have received from other than [facility] providers in the past year (date may be approximate). ? ?   ?Assessment:  ? This is a routine wellness examination for Ronalee. ? ?Hearing/Vision screen ?Vision Screening - Comments:: Regular eye exams, [facility] ? ?Dietary issues and exercise activities discussed: ?Current Exercise Habits: The patient does not participate in regular exercise at present ? ? Goals Addressed   ? ?  ?  ?  ?  ? This Visit's Progress  ?  Patient Stated     ?  08/11/2021, stay active ?  ? ?  ? ?Depression Screen ? ?  08/11/2021  ?  3:18 PM 06/14/2021  ? 10:30 AM 07/21/2020  ?  9:48 AM 11/26/2019  ? 10:41 AM 04/07/2019  ? 10:37 AM 03/28/2018  ? 10:49 AM 03/27/2017  ? 10:04 AM  ?PHQ 2/9 Scores  ?PHQ - 2 Score 0 2 0 4 0 0 2  ?PHQ- 9 Score  3 0 14 0 0 5  ?  ?Fall Risk ? ?  08/11/2021  ?  3:18 PM 07/21/2020  ?  9:47 AM 11/26/2019  ? 10:41 AM 04/07/2019  ? 10:36 AM 03/28/2018  ? 10:49 AM  ?Fall Risk   ?Falls in the past year? 0 0 0 0 1  ?Comment     loss balance and fell on buttocks  ?Number falls in past yr: 0 0  0 0  ?Injury with Fall? 0 0  0 1  ?Comment      lacerations to hands  ?Risk for fall due to : Medication side effect Medication side effect  Medication side effect   ?Follow up Falls evaluation completed;Education provided;Falls prevention discussed Falls preventi

## 2021-08-11 NOTE — Patient Instructions (Signed)
Rachel Bowers , ?Thank you for taking time to come for your Medicare Wellness Visit. I appreciate your ongoing commitment to your health goals. Please review the following plan we discussed and let me know if I can assist you in the future.  ? ?Screening recommendations/referrals: ?Colonoscopy: not required ?Mammogram: completed 04/14/2021, due 04/15/2022 ?Bone Density: completed 05/17/2021 ?Recommended yearly ophthalmology/optometry visit for glaucoma screening and checkup ?Recommended yearly dental visit for hygiene and checkup ? ?Vaccinations: ?Influenza vaccine: due 11/07/2021 ?Pneumococcal vaccine: completed 02/22/2014 ?Tdap vaccine: completed 02/17/2013, due 02/18/2023 ?Shingles vaccine: completed    ?Covid-19: 01/19/2021, 01/26/2020, 05/20/2019, 04/29/2019 ? ?Advanced directives: Please bring a copy of your POA (Power of Attorney) and/or Living Will to your next appointment.  ? ?Conditions/risks identified: none ? ?Next appointment: Follow up in one year for your annual wellness visit  ? ? ?Preventive Care 1 Years and Older, Female ?Preventive care refers to lifestyle choices and visits with your health care provider that can promote health and wellness. ?What does preventive care include? ?A yearly physical exam. This is also called an annual well check. ?Dental exams once or twice a year. ?Routine eye exams. Ask your health care provider how often you should have your eyes checked. ?Personal lifestyle choices, including: ?Daily care of your teeth and gums. ?Regular physical activity. ?Eating a healthy diet. ?Avoiding tobacco and drug use. ?Limiting alcohol use. ?Practicing safe sex. ?Taking low-dose aspirin every day. ?Taking vitamin and mineral supplements as recommended by your health care provider. ?What happens during an annual well check? ?The services and screenings done by your health care provider during your annual well check will depend on your age, overall health, lifestyle risk factors, and family history of  disease. ?Counseling  ?Your health care provider may ask you questions about your: ?Alcohol use. ?Tobacco use. ?Drug use. ?Emotional well-being. ?Home and relationship well-being. ?Sexual activity. ?Eating habits. ?History of falls. ?Memory and ability to understand (cognition). ?Work and work Statistician. ?Reproductive health. ?Screening  ?You may have the following tests or measurements: ?Height, weight, and BMI. ?Blood pressure. ?Lipid and cholesterol levels. These may be checked every 5 years, or more frequently if you are over 41 years old. ?Skin check. ?Lung cancer screening. You may have this screening every year starting at age 39 if you have a 30-pack-year history of smoking and currently smoke or have quit within the past 15 years. ?Fecal occult blood test (FOBT) of the stool. You may have this test every year starting at age 35. ?Flexible sigmoidoscopy or colonoscopy. You may have a sigmoidoscopy every 5 years or a colonoscopy every 10 years starting at age 52. ?Hepatitis C blood test. ?Hepatitis B blood test. ?Sexually transmitted disease (STD) testing. ?Diabetes screening. This is done by checking your blood sugar (glucose) after you have not eaten for a while (fasting). You may have this done every 1-3 years. ?Bone density scan. This is done to screen for osteoporosis. You may have this done starting at age 46. ?Mammogram. This may be done every 1-2 years. Talk to your health care provider about how often you should have regular mammograms. ?Talk with your health care provider about your test results, treatment options, and if necessary, the need for more tests. ?Vaccines  ?Your health care provider may recommend certain vaccines, such as: ?Influenza vaccine. This is recommended every year. ?Tetanus, diphtheria, and acellular pertussis (Tdap, Td) vaccine. You may need a Td booster every 10 years. ?Zoster vaccine. You may need this after age 21. ?Pneumococcal 13-valent conjugate (  PCV13) vaccine. One  dose is recommended after age 36. ?Pneumococcal polysaccharide (PPSV23) vaccine. One dose is recommended after age 84. ?Talk to your health care provider about which screenings and vaccines you need and how often you need them. ?This information is not intended to replace advice given to you by your health care provider. Make sure you discuss any questions you have with your health care provider. ?Document Released: 04/22/2015 Document Revised: 12/14/2015 Document Reviewed: 01/25/2015 ?Elsevier Interactive Patient Education ? 2017 [place]. ? ?Fall Prevention in the Home ?Falls can cause injuries. They can happen to people of all ages. There are many things you can do to make your home safe and to help prevent falls. ?What can I do on the outside of my home? ?Regularly fix the edges of walkways and driveways and fix any cracks. ?Remove anything that might make you trip as you walk through a door, such as a raised step or threshold. ?Trim any bushes or trees on the path to your home. ?Use bright outdoor lighting. ?Clear any walking paths of anything that might make someone trip, such as rocks or tools. ?Regularly check to see if handrails are loose or broken. Make sure that both sides of any steps have handrails. ?Any raised decks and porches should have guardrails on the edges. ?Have any leaves, snow, or ice cleared regularly. ?Use sand or salt on walking paths during winter. ?Clean up any spills in your garage right away. This includes oil or grease spills. ?What can I do in the bathroom? ?Use night lights. ?Install grab bars by the toilet and in the tub and shower. Do not use towel bars as grab bars. ?Use non-skid mats or decals in the tub or shower. ?If you need to sit down in the shower, use a plastic, non-slip stool. ?Keep the floor dry. Clean up any water that spills on the floor as soon as it happens. ?Remove soap buildup in the tub or shower regularly. ?Attach bath mats securely with double-sided  non-slip rug tape. ?Do not have throw rugs and other things on the floor that can make you trip. ?What can I do in the bedroom? ?Use night lights. ?Make sure that you have a light by your bed that is easy to reach. ?Do not use any sheets or blankets that are too big for your bed. They should not hang down onto the floor. ?Have a firm chair that has side arms. You can use this for support while you get dressed. ?Do not have throw rugs and other things on the floor that can make you trip. ?What can I do in the kitchen? ?Clean up any spills right away. ?Avoid walking on wet floors. ?Keep items that you use a lot in easy-to-reach places. ?If you need to reach something above you, use a strong step stool that has a grab bar. ?Keep electrical cords out of the way. ?Do not use floor polish or wax that makes floors slippery. If you must use wax, use non-skid floor wax. ?Do not have throw rugs and other things on the floor that can make you trip. ?What can I do with my stairs? ?Do not leave any items on the stairs. ?Make sure that there are handrails on both sides of the stairs and use them. Fix handrails that are broken or loose. Make sure that handrails are as long as the stairways. ?Check any carpeting to make sure that it is firmly attached to the stairs. Fix any carpet that is  loose or worn. ?Avoid having throw rugs at the top or bottom of the stairs. If you do have throw rugs, attach them to the floor with carpet tape. ?Make sure that you have a light switch at the top of the stairs and the bottom of the stairs. If you do not have them, ask someone to add them for you. ?What else can I do to help prevent falls? ?Wear shoes that: ?Do not have high heels. ?Have rubber bottoms. ?Are comfortable and fit you well. ?Are closed at the toe. Do not wear sandals. ?If you use a stepladder: ?Make sure that it is fully opened. Do not climb a closed stepladder. ?Make sure that both sides of the stepladder are locked into place. ?Ask  someone to hold it for you, if possible. ?Clearly mark and make sure that you can see: ?Any grab bars or handrails. ?First and last steps. ?Where the edge of each step is. ?Use tools that help you move around

## 2022-03-26 ENCOUNTER — Ambulatory Visit: Payer: Medicare Other | Admitting: Physician Assistant

## 2022-03-26 ENCOUNTER — Ambulatory Visit (HOSPITAL_COMMUNITY)
Admission: RE | Admit: 2022-03-26 | Discharge: 2022-03-26 | Disposition: A | Discharge status: Home / Self Care | Payer: Medicare Other | Source: Ambulatory Visit | Attending: Physician Assistant | Admitting: Physician Assistant

## 2022-03-26 VITALS — BP 110/66 | HR 75 | Temp 97.6°F | Resp 16 | Ht 67.0 in | Wt 148.0 lb

## 2022-03-26 DIAGNOSIS — I6529 Occlusion and stenosis of unspecified carotid artery: Secondary | ICD-10-CM | POA: Insufficient documentation

## 2022-03-26 NOTE — Progress Notes (Signed)
Office Note     CC:  follow up Requesting Provider:  Pleas Koch, NP  HPI: Rachel Bowers is a 81 y.o. (02/19/1941) female who presents for surveillance of carotid artery stenosis.  She denies any diagnosis of CVA or TIA since her last office visit in April.  Her left internal carotid velocity increased to put her into the 60 to 79% category at her last office visit.  She denies any right sided stroke symptoms, slurring speech, or changes in vision.  She continues to take her aspirin and statin daily.  She denies tobacco use.  She plans to return to the [facility] in the spring as she is somewhat fearful of contracting a respiratory illness in the winter months.   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%   Diverticulosis    per colonoscopy   Heart murmur    Hyperlipidemia    Hypertension     Past Surgical History:  Procedure Laterality Date   ABDOMINAL HYSTERECTOMY  1982   APPENDECTOMY  2008    Social History   Socioeconomic History   Marital status: Married    Spouse name: Not on file   Number of children: Not on file   Years of education: Not on file   Highest education level: Not on file  Occupational History   Occupation: retired  Tobacco Use   Smoking status: Former    Types: Cigarettes    Quit date: 01/25/1974    Years since quitting: 48.1    Passive exposure: Never   Smokeless tobacco: Never  Vaping Use   Vaping Use: Never used  Substance and Sexual Activity   Alcohol use: Yes    Alcohol/week: 1.0 standard drink of alcohol    Types: 1 Glasses of wine per week   Drug use: No   Sexual activity: Not on file  Other Topics Concern   Not on file  Social History Narrative   Married.   1 daughter and 1 son. 2 grandchildren.   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   Enjoys exercising at the [facility], reading.    Social Determinants of Health   Financial Resource  Strain: Low Risk  (08/11/2021)   Overall Financial Resource Strain (CARDIA)    Difficulty of Paying Living Expenses: Not hard at all  Food Insecurity: No Food Insecurity (08/11/2021)   Hunger Vital Sign    Worried About Running Out of Food in the Last Year: Never true    Ran Out of Food in the Last Year: Never true  Transportation Needs: No Transportation Needs (08/11/2021)   PRAPARE - Hydrologist (Medical): No    Lack of Transportation (Non-Medical): No  Physical Activity: Inactive (08/11/2021)   Exercise Vital Sign    Days of Exercise per Week: 0 days    Minutes of Exercise per Session: 0 min  Stress: No Stress Concern Present (08/11/2021)   [place]    Feeling of Stress : Not at all  Social Connections: Not on file  Intimate Partner Violence: Not At Risk (07/21/2020)   Humiliation, Afraid, Rape, and Kick questionnaire    Fear of Current or Ex-Partner: No    Emotionally Abused: No    Physically Abused: No    Sexually Abused: No    Family History  Problem Relation Age of  Onset   Heart disease Mother    Hyperlipidemia Mother    Hypertension Mother    Heart attack Mother    Lung cancer Brother     Current Outpatient Medications  Medication Sig Dispense Refill   amLODipine (NORVASC) 5 MG tablet Take 1 tablet (5 mg total) by mouth daily. for blood pressure. 90 tablet 3   aspirin 81 MG tablet Take 81 mg by mouth daily.     Calcium Carbonate-Vitamin D (CALCIUM-VITAMIN D3 PO) Take 1,000 mg by mouth daily.     Cholecalciferol (VITAMIN D3) 125 MCG (5000 UT) CAPS Take 1 capsule by mouth 3 (three) times a week.     diclofenac Sodium (VOLTAREN) 1 % GEL Apply 2 g topically 3 (three) times daily as needed. 100 g 0   irbesartan (AVAPRO) 150 MG tablet TAKE 1 TABLET BY MOUTH ONCE DAILY FOR BLOOD PRESSURE. 90 tablet 3   loratadine (CLARITIN) 10 MG tablet Take 10 mg by mouth daily.     lovastatin  (MEVACOR) 20 MG tablet TAKE 1 TABLET BY MOUTH AT  BEDTIME FOR CHOLESTEROL 90 tablet 3   meloxicam (MOBIC) 15 MG tablet Take 1 tablet (15 mg total) by mouth daily. As needed for pain. 30 tablet 0   Multiple Vitamin (MULTIVITAMIN) tablet Take 1 tablet by mouth daily.     Omega-3 Fatty Acids (FISH OIL) 1000 MG CAPS Take 1 capsule by mouth daily.     tiZANidine (ZANAFLEX) 4 MG tablet Take 1 tablet (4 mg total) by mouth at bedtime as needed for muscle spasms. 30 tablet 0   No current facility-administered medications for this visit.    Allergies  Allergen Reactions   Penicillins Hives     REVIEW OF SYSTEMS:   '[X]'$  denotes positive finding, '[ ]'$  denotes negative finding Cardiac  Comments:  Chest pain or chest pressure:    Shortness of breath upon exertion:    Short of breath when lying flat:    Irregular heart rhythm:        Vascular    Pain in calf, thigh, or hip brought on by ambulation:    Pain in feet at night that wakes you up from your sleep:     Blood clot in your veins:    Leg swelling:         Pulmonary    Oxygen at home:    Productive cough:     Wheezing:         Neurologic    Sudden weakness in arms or legs:     Sudden numbness in arms or legs:     Sudden onset of difficulty speaking or slurred speech:    Temporary loss of vision in one eye:     Problems with dizziness:         Gastrointestinal    Blood in stool:     Vomited blood:         Genitourinary    Burning when urinating:     Blood in urine:        Psychiatric    Major depression:         Hematologic    Bleeding problems:    Problems with blood clotting too easily:        Skin    Rashes or ulcers:        Constitutional    Fever or chills:      PHYSICAL EXAMINATION:  Vitals:   03/26/22 1453 03/26/22 1456  BP: 111/67 110/66  Pulse: 72 75  Resp: 16   Temp: 97.6 F (36.4 C)   TempSrc: Temporal   SpO2: 98%   Weight: 148 lb (67.1 kg)   Height: '5\' 7"'$  (1.702 m)     General:  WDWN in  NAD; vital signs documented above Gait: Not observed HENT: WNL, normocephalic Pulmonary: normal non-labored breathing , without Rales, rhonchi,  wheezing Cardiac: regular HR Abdomen: soft, NT, no masses Skin: without rashes Vascular Exam/Pulses:  Right Left  Radial 2+ (normal) 2+ (normal)  DP 2+ (normal) 1+ (weak)   Extremities: without ischemic changes, without Gangrene , without cellulitis; without open wounds;  Musculoskeletal: no muscle wasting or atrophy  Neurologic: A&O X 3; cranial nerves grossly intact Psychiatric:  The pt has Normal affect.   Non-Invasive Vascular Imaging:   Right ICA 1 to 39% stenosis Left ICA 40 to 59% stenosis    ASSESSMENT/PLAN:: 80 y.o. female here for follow up for surveillance of carotid artery stenosis  -Subjectively, she has not had any neurological symptoms since last office visit 6 months ago -Right ICA is measured to be between 1 and 39% stenosis; left ICA is in the 40 to 59% stenosis.  We will return to annual surveillance.  She is aware she may flip back into the 60 to 79% stenosis category at next office visit.  We also discussed indications to treat symptomatic blockages above 50% and asymptomatic blockages above 80% -She will continue her aspirin and statin daily -She knows to call/return office sooner with any questions or concerns.  Otherwise we will see her for her repeat duplex in 1 year  Dagoberto Ligas, PA-C [facility] [phone]  Clinic MD:   Virl Cagey on call

## 2022-04-20 DIAGNOSIS — Z1231 Encounter for screening mammogram for malignant neoplasm of breast: Secondary | ICD-10-CM | POA: Diagnosis not present

## 2022-04-20 LAB — HM MAMMOGRAPHY

## 2022-04-23 ENCOUNTER — Encounter: Payer: Self-pay | Admitting: Primary Care

## 2022-05-11 ENCOUNTER — Other Ambulatory Visit: Payer: Self-pay | Admitting: Primary Care

## 2022-05-11 DIAGNOSIS — I10 Essential (primary) hypertension: Secondary | ICD-10-CM

## 2022-05-12 IMAGING — DX DG CERVICAL SPINE 2 OR 3 VIEWS
4 series · 4 of 4 positions shown · non-contrast
Comparison: CT 12/09/2013

CLINICAL DATA: Acute neck pain

EXAM:
CERVICAL SPINE - 2-3 VIEW

[c-spine lat]
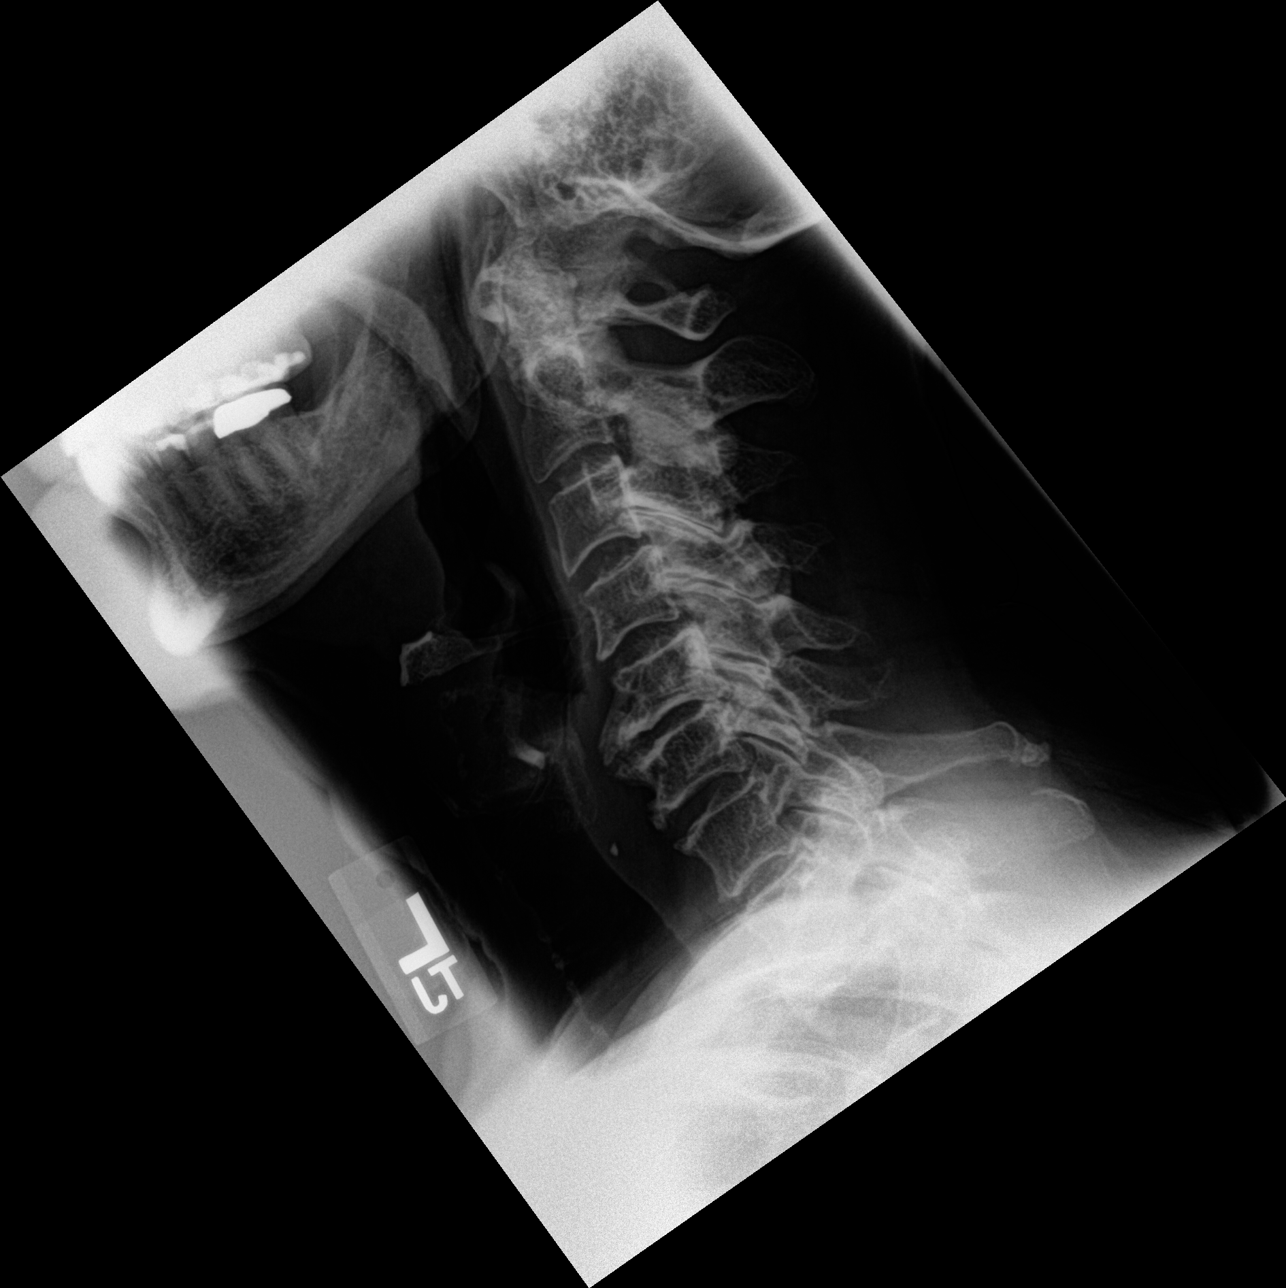

[c-spine ap]
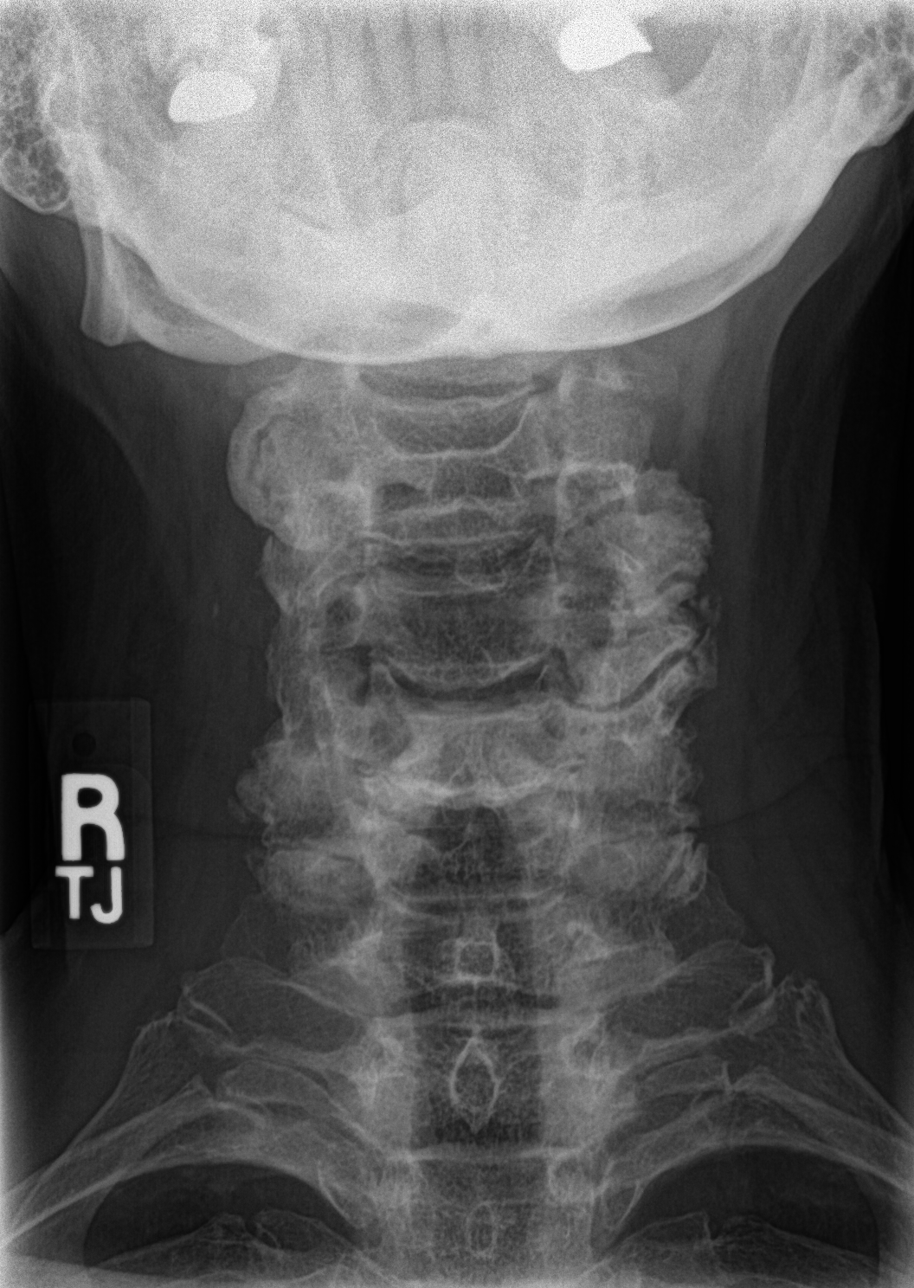

[c-spine open mouth]
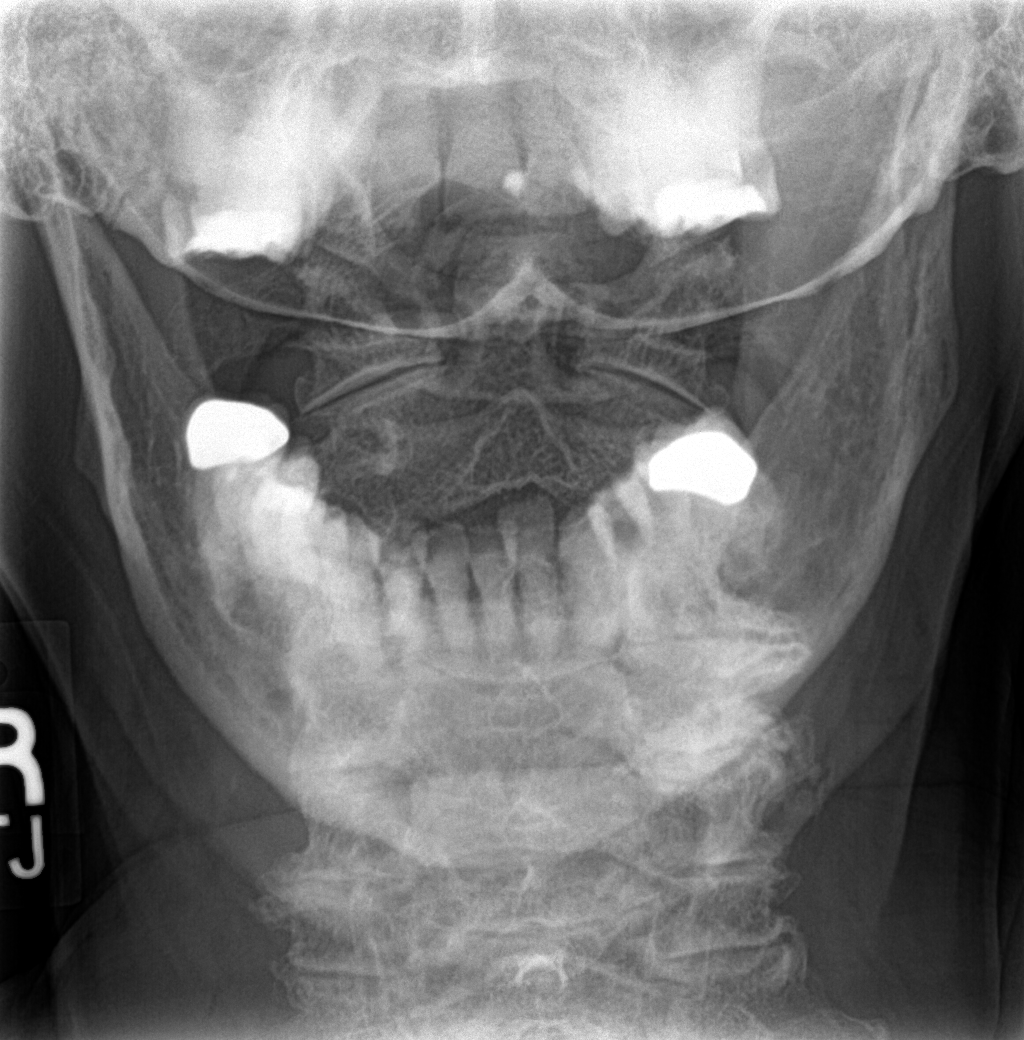

[c-spine swimmers]
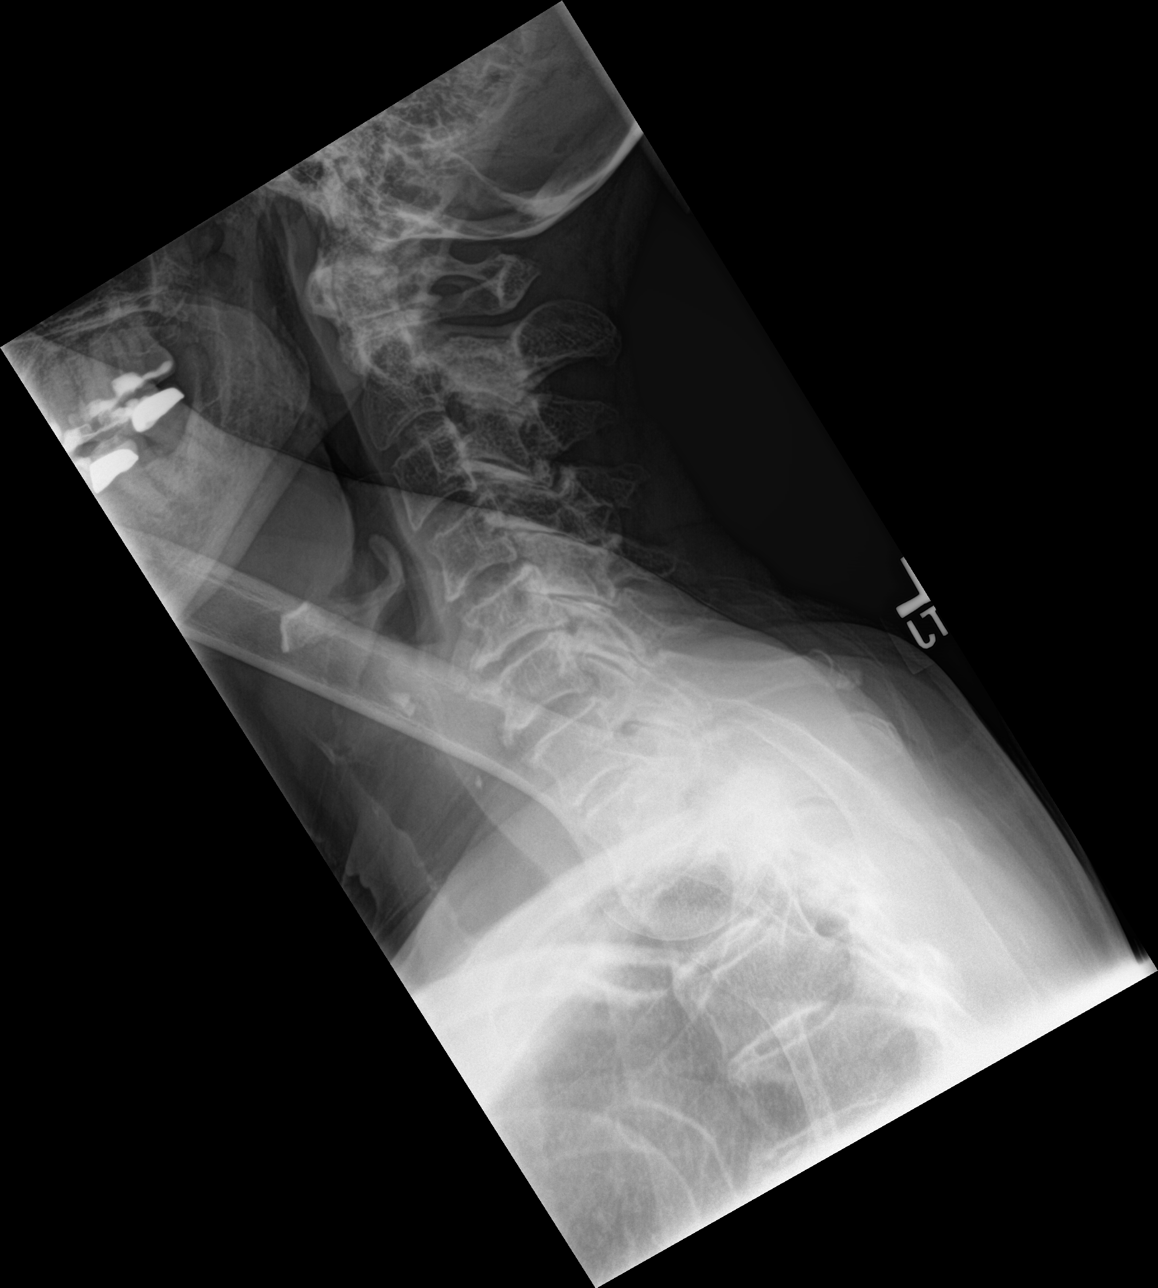

[4 of 4 positions shown; findings below may reference images not displayed]

FINDINGS: Alignment is normal. Pronounced facet arthropathy on both sides
which could be painful. Degenerative spondylosis C5-6 with endplate
osteophytes.
IMPRESSION: Pronounced cervical facet arthropathy which could be painful.
Degenerative spondylosis at C5-6.

## 2022-06-05 DIAGNOSIS — H35372 Puckering of macula, left eye: Secondary | ICD-10-CM | POA: Diagnosis not present

## 2022-06-05 DIAGNOSIS — H1045 Other chronic allergic conjunctivitis: Secondary | ICD-10-CM | POA: Diagnosis not present

## 2022-06-05 DIAGNOSIS — H43811 Vitreous degeneration, right eye: Secondary | ICD-10-CM | POA: Diagnosis not present

## 2022-06-05 DIAGNOSIS — H04123 Dry eye syndrome of bilateral lacrimal glands: Secondary | ICD-10-CM | POA: Diagnosis not present

## 2022-06-05 DIAGNOSIS — D3132 Benign neoplasm of left choroid: Secondary | ICD-10-CM | POA: Diagnosis not present

## 2022-06-05 DIAGNOSIS — H0288A Meibomian gland dysfunction right eye, upper and lower eyelids: Secondary | ICD-10-CM | POA: Diagnosis not present

## 2022-06-05 DIAGNOSIS — H2511 Age-related nuclear cataract, right eye: Secondary | ICD-10-CM | POA: Diagnosis not present

## 2022-06-05 DIAGNOSIS — H43393 Other vitreous opacities, bilateral: Secondary | ICD-10-CM | POA: Diagnosis not present

## 2022-06-05 DIAGNOSIS — H0288B Meibomian gland dysfunction left eye, upper and lower eyelids: Secondary | ICD-10-CM | POA: Diagnosis not present

## 2022-06-19 ENCOUNTER — Encounter: Payer: Medicare Other | Admitting: Primary Care

## 2022-06-21 ENCOUNTER — Other Ambulatory Visit: Payer: Self-pay | Admitting: Primary Care

## 2022-06-21 DIAGNOSIS — E785 Hyperlipidemia, unspecified: Secondary | ICD-10-CM

## 2022-06-22 DIAGNOSIS — L814 Other melanin hyperpigmentation: Secondary | ICD-10-CM | POA: Diagnosis not present

## 2022-06-22 DIAGNOSIS — D225 Melanocytic nevi of trunk: Secondary | ICD-10-CM | POA: Diagnosis not present

## 2022-06-22 DIAGNOSIS — Z85828 Personal history of other malignant neoplasm of skin: Secondary | ICD-10-CM | POA: Diagnosis not present

## 2022-06-22 DIAGNOSIS — L570 Actinic keratosis: Secondary | ICD-10-CM | POA: Diagnosis not present

## 2022-06-22 DIAGNOSIS — L821 Other seborrheic keratosis: Secondary | ICD-10-CM | POA: Diagnosis not present

## 2022-06-22 DIAGNOSIS — L853 Xerosis cutis: Secondary | ICD-10-CM | POA: Diagnosis not present

## 2022-06-22 DIAGNOSIS — Z08 Encounter for follow-up examination after completed treatment for malignant neoplasm: Secondary | ICD-10-CM | POA: Diagnosis not present

## 2022-06-27 ENCOUNTER — Ambulatory Visit (INDEPENDENT_AMBULATORY_CARE_PROVIDER_SITE_OTHER): Payer: Medicare Other | Admitting: Primary Care

## 2022-06-27 ENCOUNTER — Encounter: Payer: Self-pay | Admitting: Primary Care

## 2022-06-27 VITALS — BP 102/60 | HR 63 | Temp 98.2°F | Ht 66.25 in | Wt 151.0 lb

## 2022-06-27 DIAGNOSIS — M542 Cervicalgia: Secondary | ICD-10-CM

## 2022-06-27 DIAGNOSIS — G8929 Other chronic pain: Secondary | ICD-10-CM

## 2022-06-27 DIAGNOSIS — I10 Essential (primary) hypertension: Secondary | ICD-10-CM

## 2022-06-27 DIAGNOSIS — R7303 Prediabetes: Secondary | ICD-10-CM | POA: Diagnosis not present

## 2022-06-27 DIAGNOSIS — Z0000 Encounter for general adult medical examination without abnormal findings: Secondary | ICD-10-CM

## 2022-06-27 DIAGNOSIS — E785 Hyperlipidemia, unspecified: Secondary | ICD-10-CM

## 2022-06-27 DIAGNOSIS — I6523 Occlusion and stenosis of bilateral carotid arteries: Secondary | ICD-10-CM | POA: Diagnosis not present

## 2022-06-27 DIAGNOSIS — M545 Low back pain, unspecified: Secondary | ICD-10-CM | POA: Diagnosis not present

## 2022-06-27 DIAGNOSIS — M159 Polyosteoarthritis, unspecified: Secondary | ICD-10-CM | POA: Diagnosis not present

## 2022-06-27 LAB — HEMOGLOBIN A1C: Hgb A1c MFr Bld: 6.2 % (ref 4.6–6.5)

## 2022-06-27 LAB — COMPREHENSIVE METABOLIC PANEL
ALT: 21 U/L (ref 0–35)
AST: 16 U/L (ref 0–37)
Albumin: 4.4 g/dL (ref 3.5–5.2)
Alkaline Phosphatase: 63 U/L (ref 39–117)
BUN: 18 mg/dL (ref 6–23)
CO2: 29 mEq/L (ref 19–32)
Calcium: 9.9 mg/dL (ref 8.4–10.5)
Chloride: 103 mEq/L (ref 96–112)
Creatinine, Ser: 0.85 mg/dL (ref 0.40–1.20)
GFR: 64.22 mL/min (ref >60.00)
Glucose, Bld: 95 mg/dL (ref 70–99)
Potassium: 4.3 mEq/L (ref 3.5–5.1)
Sodium: 140 mEq/L (ref 135–145)
Total Bilirubin: 0.7 mg/dL (ref 0.2–1.2)
Total Protein: 7 g/dL (ref 6.0–8.3)

## 2022-06-27 LAB — LIPID PANEL
Cholesterol: 168 mg/dL (ref 0–200)
HDL: 73.3 mg/dL (ref >39.00)
LDL Cholesterol: 80 mg/dL (ref 0–99)
NonHDL: 94.74
Total CHOL/HDL Ratio: 2
Triglycerides: 76 mg/dL (ref 0.0–149.0)
VLDL: 15.2 mg/dL (ref 0.0–40.0)

## 2022-06-27 LAB — SEDIMENTATION RATE: Sed Rate: 7 mm/hr (ref 0–30)

## 2022-06-27 LAB — C-REACTIVE PROTEIN: CRP: <1 mg/dL (ref 0.5–20.0)

## 2022-06-27 MED ORDER — TIZANIDINE HCL 4 MG PO TABS: 4.0000 mg | ORAL_TABLET | Freq: Every evening | ORAL | 0 refills | Status: AC | PRN

## 2022-06-27 MED ORDER — MELOXICAM 15 MG PO TABS: 15.0000 mg | ORAL_TABLET | Freq: Every day | ORAL | 0 refills | Status: AC

## 2022-06-27 NOTE — Assessment & Plan Note (Signed)
Repeat lipid panel pending.  Continue lovastatin 20 mg.

## 2022-06-27 NOTE — Assessment & Plan Note (Signed)
Repeat A1C pending.   Discussed the importance of a healthy diet and regular exercise in order for weight loss, and to reduce the risk of further co-morbidity.  

## 2022-06-27 NOTE — Progress Notes (Signed)
Subjective:    Patient ID: Rachel Bowers, female    DOB: 01-19-1941, 82 y.o.   MRN: [id number]  HPI  Rachel Bowers is a very pleasant 82 y.o. female who presents today for complete physical and follow up of chronic conditions.   Immunizations: -Tetanus: Completed in 2014 -Influenza: Completed this season -Shingles: Completed Shingrix series -Pneumonia: Completed Prevnar 13 in 2015 and pneumovax 23 in 2009  Diet: [place].  Exercise: No regular exercise.  Eye exam: Completes annually  Dental exam: Completes semi-annually    Mammogram: January 2024 Bone Density Scan: February 2023  Colonoscopy: Completed in 2016, no further imaging needed given age  BP Readings from Last 3 Encounters:  06/27/22 102/60  03/26/22 110/66  07/31/21 (!) 148/76         Review of Systems  Constitutional:  Negative for unexpected weight change.  HENT:  Negative for rhinorrhea.   Respiratory:  Negative for cough and shortness of breath.   Cardiovascular:  Negative for chest pain.  Gastrointestinal:  Negative for constipation and diarrhea.  Genitourinary:  Negative for difficulty urinating.  Musculoskeletal:  Positive for arthralgias.  Skin:  Negative for rash.  Allergic/Immunologic: Negative for environmental allergies.  Neurological:  Negative for dizziness and headaches.  Psychiatric/Behavioral:  The patient is not nervous/anxious.          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%   Diverticulosis    per colonoscopy   Heart murmur    Hyperlipidemia    Hypertension     Social History   Socioeconomic History   Marital status: Married    Spouse name: Not on file   Number of children: Not on file   Years of education: Not on file   Highest education level: Not on file  Occupational History   Occupation: retired  Tobacco Use   Smoking status: Former    Types: Cigarettes    Quit date: 01/25/1974    Years since quitting:  48.4    Passive exposure: Never   Smokeless tobacco: Never  Vaping Use   Vaping Use: Never used  Substance and Sexual Activity   Alcohol use: Yes    Alcohol/week: 1.0 standard drink of alcohol    Types: 1 Glasses of wine per week   Drug use: No   Sexual activity: Not on file  Other Topics Concern   Not on file  Social History Narrative   Married.   1 daughter and 1 son. 2 grandchildren.   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   Enjoys exercising at the [facility], reading.    Social Determinants of Health   Financial Resource Strain: Low Risk  (08/11/2021)   Overall Financial Resource Strain (CARDIA)    Difficulty of Paying Living Expenses: Not hard at all  Food Insecurity: No Food Insecurity (08/11/2021)   Hunger Vital Sign    Worried About Running Out of Food in the Last Year: Never true    Ran Out of Food in the Last Year: Never true  Transportation Needs: No Transportation Needs (08/11/2021)   PRAPARE - Hydrologist (Medical): No    Lack of Transportation (Non-Medical): No  Physical Activity: Inactive (08/11/2021)   Exercise Vital Sign    Days of Exercise per Week: 0 days    Minutes of Exercise per Session: 0 min  Stress: No Stress  Concern Present (08/11/2021)   [place]    Feeling of Stress : Not at all  Social Connections: Not on file  Intimate Partner Violence: Not At Risk (07/21/2020)   Humiliation, Afraid, Rape, and Kick questionnaire    Fear of Current or Ex-Partner: No    Emotionally Abused: No    Physically Abused: No    Sexually Abused: No    Past Surgical History:  Procedure Laterality Date   ABDOMINAL HYSTERECTOMY  1982   APPENDECTOMY  2008    Family History  Problem Relation Age of Onset   Heart disease Mother    Hyperlipidemia Mother    Hypertension Mother    Heart attack Mother    Lung cancer Brother     Allergies   Allergen Reactions   Penicillins Hives    Current Outpatient Medications on File Prior to Visit  Medication Sig Dispense Refill   amLODipine (NORVASC) 5 MG tablet TAKE 1 TABLET BY MOUTH DAILY FOR BLOOD PRESSURE 90 tablet 0   aspirin 81 MG tablet Take 81 mg by mouth daily.     Calcium Carbonate-Vitamin D (CALCIUM-VITAMIN D3 PO) Take 1,000 mg by mouth daily.     Cholecalciferol (VITAMIN D3) 125 MCG (5000 UT) CAPS Take 1 capsule by mouth 3 (three) times a week.     irbesartan (AVAPRO) 150 MG tablet TAKE 1 TABLET BY MOUTH ONCE DAILY FOR BLOOD PRESSURE. 90 tablet 3   loratadine (CLARITIN) 10 MG tablet Take 10 mg by mouth daily.     lovastatin (MEVACOR) 20 MG tablet TAKE 1 TABLET BY MOUTH AT  BEDTIME FOR CHOLESTEROL 90 tablet 0   Multiple Vitamin (MULTIVITAMIN) tablet Take 1 tablet by mouth daily.     Omega-3 Fatty Acids (FISH OIL) 1000 MG CAPS Take 1 capsule by mouth daily.     diclofenac Sodium (VOLTAREN) 1 % GEL Apply 2 g topically 3 (three) times daily as needed. (Patient not taking: Reported on 06/27/2022) 100 g 0   No current facility-administered medications on file prior to visit.    BP 102/60   Pulse 63   Temp 98.2 F (36.8 C) (Temporal)   Ht 5' 6.25" (1.683 m)   Wt 151 lb (68.5 kg)   SpO2 95%   BMI 24.19 kg/m  Objective:   Physical Exam HENT:     Right Ear: Tympanic membrane and ear canal normal.     Left Ear: Tympanic membrane and ear canal normal.     Nose: Nose normal.  Eyes:     Conjunctiva/sclera: Conjunctivae normal.     Pupils: Pupils are equal, round, and reactive to light.  Neck:     Thyroid: No thyromegaly.  Cardiovascular:     Rate and Rhythm: Normal rate and regular rhythm.     Heart sounds: No murmur heard. Pulmonary:     Effort: Pulmonary effort is normal.     Breath sounds: Normal breath sounds. No rales.  Abdominal:     General: Bowel sounds are normal.     Palpations: Abdomen is soft.     Tenderness: There is no abdominal tenderness.   Musculoskeletal:     Cervical back: Neck supple.     Comments: Decrease in ROM and swelling to all fingers at PIP joints to bilateral hands  Lymphadenopathy:     Cervical: No cervical adenopathy.  Skin:    General: Skin is warm and dry.     Findings: No rash.  Neurological:  Mental Status: She is alert and oriented to person, place, and time.     Cranial Nerves: No cranial nerve deficit.     Deep Tendon Reflexes: Reflexes are normal and symmetric.  Psychiatric:        Mood and Affect: Mood normal.           Assessment & Plan:  Essential hypertension Assessment & Plan: Controlled, borderline too low.  She is asymptomatic. I've asked that she start monitoring home BP readings and update.   Continue amlodipine 5 mg daily, irbesartan 150 mg daily. CMP pending.  Orders: -     Comprehensive metabolic panel  Hyperlipidemia, unspecified hyperlipidemia type Assessment & Plan: Repeat lipid panel pending.  Continue lovastatin 20 mg.   Orders: -     Lipid panel  Primary osteoarthritis involving multiple joints Assessment & Plan: Chronic and continued. Evident to hands today.  Will test today for rheumatoid arthritis.   Orders: -     Rheumatoid factor -     C-reactive protein -     Cyclic citrul peptide antibody, IgG -     Sedimentation rate  Chronic bilateral low back pain without sciatica Assessment & Plan: Chronic, overall stable.  Continue tizanidine 4 mg HS PRN and Meloxicam 15 mg PRN. She uses both infrequently.    Chronic neck pain Assessment & Plan: Overall stable.  Continue Tizanidine 4 mg HS PRN and Meloixcam 15 mg PRN.  Orders: -     Meloxicam; Take 1 tablet (15 mg total) by mouth daily. As needed for pain.  Dispense: 30 tablet; Refill: 0 -     tiZANidine HCl; Take 1 tablet (4 mg total) by mouth at bedtime as needed for muscle spasms.  Dispense: 30 tablet; Refill: 0  Prediabetes Assessment & Plan: Repeat A1C pending.  Discussed the  importance of a healthy diet and regular exercise in order for weight loss, and to reduce the risk of further co-morbidity.   Orders: -     Hemoglobin A1c  Bilateral carotid artery stenosis Assessment & Plan: Reviewed carotid studies from April 2023 and December 2023. Left ICA Improved compared to April 2023.   Continue lovastatin 20 mg daily and aspirin 81 mg daily.  Following with vascular services, office notes reviewed from December 2023.   Preventative health care Assessment & Plan: Immunizations UTD. Mammogram UTD.  Colonoscopy UTD, no further imaging needed given age.  Discussed the importance of a healthy diet and regular exercise in order for weight loss, and to reduce the risk of further co-morbidity.  Exam stable. Labs pending.  Follow up in 1 year for repeat physical.          Pleas Koch, NP

## 2022-06-27 NOTE — Patient Instructions (Addendum)
Stop by the lab prior to leaving today. I will notify you of your results once received.   It was a pleasure to see you today!  

## 2022-06-27 NOTE — Assessment & Plan Note (Addendum)
Reviewed carotid studies from April 2023 and December 2023. Left ICA Improved compared to April 2023.   Continue lovastatin 20 mg daily and aspirin 81 mg daily.  Following with vascular services, office notes reviewed from December 2023.

## 2022-06-27 NOTE — Assessment & Plan Note (Signed)
Chronic and continued. Evident to hands today.  Will test today for rheumatoid arthritis.

## 2022-06-27 NOTE — Assessment & Plan Note (Signed)
Immunizations UTD. Mammogram UTD.  Colonoscopy UTD, no further imaging needed given age.  Discussed the importance of a healthy diet and regular exercise in order for weight loss, and to reduce the risk of further co-morbidity.  Exam stable. Labs pending.  Follow up in 1 year for repeat physical.

## 2022-06-27 NOTE — Assessment & Plan Note (Addendum)
Controlled, borderline too low.  She is asymptomatic. I've asked that she start monitoring home BP readings and update.   Continue amlodipine 5 mg daily, irbesartan 150 mg daily. CMP pending.

## 2022-06-27 NOTE — Assessment & Plan Note (Addendum)
Chronic, overall stable.  Continue tizanidine 4 mg HS PRN and Meloxicam 15 mg PRN. She uses both infrequently.

## 2022-06-27 NOTE — Assessment & Plan Note (Signed)
Overall stable.  Continue Tizanidine 4 mg HS PRN and Meloixcam 15 mg PRN.

## 2022-06-30 LAB — RHEUMATOID FACTOR: Rheumatoid fact SerPl-aCnc: <14 IU/mL (ref <14)

## 2022-06-30 LAB — CYCLIC CITRUL PEPTIDE ANTIBODY, IGG: Cyclic Citrullin Peptide Ab: <16 UNITS

## 2022-07-13 ENCOUNTER — Other Ambulatory Visit: Payer: Self-pay | Admitting: Primary Care

## 2022-07-13 DIAGNOSIS — I10 Essential (primary) hypertension: Secondary | ICD-10-CM

## 2022-07-30 ENCOUNTER — Other Ambulatory Visit: Payer: Self-pay | Admitting: Primary Care

## 2022-07-30 DIAGNOSIS — I10 Essential (primary) hypertension: Secondary | ICD-10-CM

## 2022-08-23 ENCOUNTER — Other Ambulatory Visit: Payer: Self-pay | Admitting: Primary Care

## 2022-08-23 DIAGNOSIS — E785 Hyperlipidemia, unspecified: Secondary | ICD-10-CM

## 2022-09-25 ENCOUNTER — Ambulatory Visit (INDEPENDENT_AMBULATORY_CARE_PROVIDER_SITE_OTHER): Payer: Medicare Other

## 2022-09-25 VITALS — Ht 67.0 in | Wt 146.0 lb

## 2022-09-25 DIAGNOSIS — Z0000 Encounter for general adult medical examination without abnormal findings: Secondary | ICD-10-CM

## 2022-09-25 NOTE — Progress Notes (Signed)
Subjective:   Rachel Bowers is a 82 y.o. female who presents for Medicare Annual (Subsequent) preventive examination.  Visit Complete: Virtual  I connected with  Gwenyth Bouillon on 09/25/22 by a audio enabled telemedicine application and verified that I am speaking with the correct person using two identifiers.  Patient Location: Home  Provider Location: Office/Clinic  I discussed the limitations of evaluation and management by telemedicine. The patient expressed understanding and agreed to proceed.    Review of Systems      Cardiac Risk Factors include: advanced age (>44men, >41 women);hypertension;dyslipidemia     Objective:    Today's Vitals   09/25/22 1538  Weight: 146 lb (66.2 kg)  Height: 5\' 7"  (1.702 m)   Body mass index is 22.87 kg/m.     09/25/2022    3:56 PM 08/11/2021    3:17 PM 07/21/2020    9:46 AM 04/07/2019   10:35 AM 03/28/2018   11:07 AM 03/27/2017   10:03 AM 01/31/2015    1:25 PM  Advanced Directives  Does Patient Have a Medical Advance Directive? Yes Yes Yes Yes Yes Yes Yes  Type of Estate agent of [place];Living will Healthcare Power of [place];Living will Healthcare Power of [place];Living will Healthcare Power of [place];Living will Healthcare Power of [place];Living will Healthcare Power of [place];Living will Healthcare Power of [place];Living will  Does patient want to make changes to medical advance directive?       No - Patient declined  Copy of Healthcare Power of Attorney in Chart? No - copy requested No - copy requested No - copy requested No - copy requested No - copy requested No - copy requested No - copy requested    Current Medications (verified) Outpatient Encounter Medications as of 09/25/2022  Medication Sig   amLODipine (NORVASC) 5 MG tablet TAKE 1 TABLET BY MOUTH DAILY FOR BLOOD PRESSURE   aspirin 81 MG tablet Take 81 mg by mouth daily.   Calcium Carbonate-Vitamin D (CALCIUM-VITAMIN D3 PO) Take 1,000  mg by mouth daily.   Cholecalciferol (VITAMIN D3) 125 MCG (5000 UT) CAPS Take 1 capsule by mouth 3 (three) times a week.   irbesartan (AVAPRO) 150 MG tablet TAKE 1 TABLET BY MOUTH ONCE DAILY FOR BLOOD PRESSURE.   loratadine (CLARITIN) 10 MG tablet Take 10 mg by mouth daily.   lovastatin (MEVACOR) 20 MG tablet TAKE 1 TABLET BY MOUTH AT  BEDTIME FOR CHOLESTEROL   meloxicam (MOBIC) 15 MG tablet Take 1 tablet (15 mg total) by mouth daily. As needed for pain.   Multiple Vitamin (MULTIVITAMIN) tablet Take 1 tablet by mouth daily.   Omega-3 Fatty Acids (FISH OIL) 1000 MG CAPS Take 1 capsule by mouth daily.   tiZANidine (ZANAFLEX) 4 MG tablet Take 1 tablet (4 mg total) by mouth at bedtime as needed for muscle spasms.   diclofenac Sodium (VOLTAREN) 1 % GEL Apply 2 g topically 3 (three) times daily as needed. (Patient not taking: Reported on 06/27/2022)   No facility-administered encounter medications on file as of 09/25/2022.    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%   Diverticulosis    per colonoscopy   Heart murmur    Hyperlipidemia    Hypertension    Past Surgical History:  Procedure Laterality Date   ABDOMINAL HYSTERECTOMY  1982   APPENDECTOMY  2008   Family History  Problem Relation Age  of Onset   Heart disease Mother    Hyperlipidemia Mother    Hypertension Mother    Heart attack Mother    Lung cancer Brother    Social History   Socioeconomic History   Marital status: Married    Spouse name: Not on file   Number of children: Not on file   Years of education: Not on file   Highest education level: Not on file  Occupational History   Occupation: retired  Tobacco Use   Smoking status: Former    Types: Cigarettes    Quit date: 01/25/1974    Years since quitting: 48.6    Passive exposure: Never   Smokeless tobacco: Never  Vaping Use   Vaping Use: Never used  Substance and  Sexual Activity   Alcohol use: Yes    Alcohol/week: 1.0 standard drink of alcohol    Types: 1 Glasses of wine per week   Drug use: No   Sexual activity: Not on file  Other Topics Concern   Not on file  Social History Narrative   Married.   1 daughter and 1 son. 2 grandchildren.   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   Enjoys exercising at the [facility], reading.    Social Determinants of Health   Financial Resource Strain: Low Risk  (09/25/2022)   Overall Financial Resource Strain (CARDIA)    Difficulty of Paying Living Expenses: Not hard at all  Food Insecurity: No Food Insecurity (09/25/2022)   Hunger Vital Sign    Worried About Running Out of Food in the Last Year: Never true    Ran Out of Food in the Last Year: Never true  Transportation Needs: No Transportation Needs (09/25/2022)   PRAPARE - Administrator, Civil Service (Medical): No    Lack of Transportation (Non-Medical): No  Physical Activity: Sufficiently Active (09/25/2022)   Exercise Vital Sign    Days of Exercise per Week: 3 days    Minutes of Exercise per Session: 50 min  Stress: No Stress Concern Present (09/25/2022)   [employer] of Occupational Health - Occupational Stress Questionnaire    Feeling of Stress : Not at all  Social Connections: Moderately Integrated (09/25/2022)   Social Connection and Isolation Panel [NHANES]    Frequency of Communication with Friends and Family: More than three times a week    Frequency of Social Gatherings with Friends and Family: More than three times a week    Attends Religious Services: More than 4 times per year    Active Member of [employer] or Organizations: Yes    Attends Banker Meetings: More than 4 times per year    Marital Status: Widowed    Tobacco Counseling Counseling given: Not Answered   Clinical Intake:  Pre-visit preparation completed: Yes  Pain : No/denies pain     BMI - recorded:  22.87 Nutritional Status: BMI of 19-24  Normal Nutritional Risks: None Diabetes: No  How often do you need to have someone help you when you read instructions, pamphlets, or other written materials from your doctor or pharmacy?: 1 - Never  Interpreter Needed?: No  Information entered by :: R. Laws LPN   Activities of Daily Living    09/25/2022    3:59 PM  In your present state of health, do you have any difficulty performing the following activities:  Hearing? 0  Vision? 1  Comment has cataracts  Difficulty concentrating or making  decisions? 0  Walking or climbing stairs? 0  Dressing or bathing? 0  Doing errands, shopping? 0  Preparing Food and eating ? N  Using the Toilet? N  In the past six months, have you accidently leaked urine? N  Do you have problems with loss of bowel control? N  Managing your Medications? N  Managing your Finances? N  Housekeeping or managing your Housekeeping? N    Patient Care Team: Doreene Nest, NP as PCP - General (Internal Medicine)  Indicate any recent Medical Services you may have received from other than [facility] providers in the past year (date may be approximate).     Assessment:   This is a routine wellness examination for Tracyann.  Hearing/Vision screen Hearing Screening - Comments:: No issues Vision Screening - Comments:: Contact in right eye, has a cataract, Dr. Dione Booze  Dietary issues and exercise activities discussed:     Goals Addressed   None    Depression Screen    09/25/2022    3:51 PM 06/27/2022   11:13 AM 08/11/2021    3:18 PM 06/14/2021   10:30 AM 07/21/2020    9:48 AM 11/26/2019   10:41 AM 04/07/2019   10:37 AM  PHQ 2/9 Scores  PHQ - 2 Score 0 0 0 2 0 4 0  PHQ- 9 Score    3 0 14 0    Fall Risk    09/25/2022    3:58 PM 06/27/2022   11:13 AM 08/11/2021    3:18 PM 07/21/2020    9:47 AM 11/26/2019   10:41 AM  Fall Risk   Falls in the past year? 0 0 0 0 0  Number falls in past yr: 0 0 0 0   Injury with Fall? 0 0  0 0   Risk for fall due to : No Fall Risks No Fall Risks Medication side effect Medication side effect   Follow up Falls prevention discussed;Falls evaluation completed Falls evaluation completed Falls evaluation completed;Education provided;Falls prevention discussed Falls prevention discussed;Falls evaluation completed     MEDICARE RISK AT HOME:  Medicare Risk at Home - 09/25/22 1602     Any stairs in or around the home? Yes    If so, are there any without handrails? No    Home free of loose throw rugs in walkways, pet beds, electrical cords, etc? Yes    Adequate lighting in your home to reduce risk of falls? Yes    Life alert? No    Use of a cane, walker or w/c? No    Grab bars in the bathroom? Yes    Shower chair or bench in shower? Yes    Elevated toilet seat or a handicapped toilet? Yes              Cognitive Function:    07/21/2020    9:51 AM 04/07/2019   10:39 AM 03/28/2018   10:48 AM 03/27/2017   10:04 AM  MMSE - Mini Mental State Exam  Not completed: Refused     Orientation to time  5 5 5   Orientation to Place  5 5 5   Registration  3 3 3   Attention/ Calculation  5 0 0  Recall  3 3 3   Language- name 2 objects   0 0  Language- repeat  1 1 1   Language- follow 3 step command   3 3  Language- read & follow direction   0 0  Write a sentence   0 0  Copy  design   0 0  Total score   20 20        09/25/2022    4:01 PM 08/11/2021    3:20 PM  6CIT Screen  What Year? 0 points 0 points  What month? 0 points 0 points  What time? 0 points 0 points  Count back from 20 0 points 0 points  Months in reverse 0 points 0 points  Repeat phrase 0 points 0 points  Total Score 0 points 0 points    Immunizations Immunization History  Administered Date(s) Administered   Fluad Quad(high Dose 65+) 12/19/2018, 01/09/2020, 01/19/2021   Influenza,inj,Quad PF,6+ Mos 12/30/2015, 01/11/2017, 01/09/2018   Influenza-Unspecified 12/24/2014, 01/08/2022   PFIZER(Purple Top)SARS-COV-2  Vaccination 04/29/2019, 05/20/2019, 01/26/2020, 01/19/2021   Pneumococcal Conjugate-13 02/22/2014   Pneumococcal Polysaccharide-23 01/14/2006, 02/06/2008   RSV,unspecified 02/07/2022   Respiratory Syncytial Virus Vaccine,Recomb Aduvanted(Arexvy) 02/22/2022   Tdap 02/17/2013   Zoster Recombinat (Shingrix) 05/09/2021, 07/26/2021   Zoster, Live 02/12/2011    TDAP status: Up to date  Flu Vaccine status: Up to date  Pneumococcal vaccine status: Up to date  Covid-19 vaccine status: Information provided on how to obtain vaccines.   Qualifies for Shingles Vaccine? Yes   Zostavax completed  unknown   Shingrix Completed?: Yes  Screening Tests Health Maintenance  Topic Date Due   COVID-19 Vaccine (5 - 2023-24 season) 12/08/2021   INFLUENZA VACCINE  11/08/2022   DTaP/Tdap/Td (2 - Td or Tdap) 02/18/2023   DEXA SCAN  05/18/2023   Medicare Annual Wellness (AWV)  09/25/2023   Pneumonia Vaccine 59+ Years old  Completed   Zoster Vaccines- Shingrix  Completed   HPV VACCINES  Aged Out   Hepatitis C Screening  Discontinued    Health Maintenance  Health Maintenance Due  Topic Date Due   COVID-19 Vaccine (5 - 2023-24 season) 12/08/2021    Colorectal cancer screening: No longer required.   Mammogram status: Completed 04/20/22. Repeat every year  Bone Density status: Completed 05/17/21. Results reflect: Bone density results: OSTEOPENIA. Repeat every 2 years.  Lung Cancer Screening: (Low Dose CT Chest recommended if Age 71-80 years, 20 pack-year currently smoking OR have quit w/in 15years.) does not qualify.   Lung Cancer Screening Referral: no  Additional Screening:  Hepatitis C Screening: does qualify; Completed 06/02/20  Vision Screening: Recommended annual ophthalmology exams for early detection of glaucoma and other disorders of the eye. Is the patient up to date with their annual eye exam?  Yes  Who is the provider or what is the name of the office in which the patient attends  annual eye exams? Dr. Dione Booze If pt is not established with a provider, would they like to be referred to a provider to establish care? Yes .   Dental Screening: Recommended annual dental exams for proper oral hygiene    Community Resource Referral / Chronic Care Management: CRR required this visit?  No   CCM required this visit?  No     Plan:     I have personally reviewed and noted the following in the patient's chart:   Medical and social history Use of alcohol, tobacco or illicit drugs  Current medications and supplements including opioid prescriptions. Patient is not currently taking opioid prescriptions. Functional ability and status Nutritional status Physical activity Advanced directives List of other physicians Hospitalizations, surgeries, and ER visits in previous 12 months Vitals Screenings to include cognitive, depression, and falls Referrals and appointments  In addition, I have reviewed and discussed with patient  certain preventive protocols, quality metrics, and best practice recommendations. A written personalized care plan for preventive services as well as general preventive health recommendations were provided to patient.     Maryan Puls, LPN   07/16/8117   After Visit Summary: (Declined) Due to this being a telephonic visit, with patients personalized plan was offered to patient but patient Declined AVS at this time   Nurse Notes: Patient has an ophthalmologist appointment upcoming regarding cataract Declined colonoscopy

## 2022-09-25 NOTE — Patient Instructions (Signed)
Rachel Bowers , Thank you for taking time to come for your Medicare Wellness Visit. I appreciate your ongoing commitment to your health goals. Please review the following plan we discussed and let me know if I can assist you in the future.   These are the goals we discussed:  Goals      Increase physical activity     Starting 03/28/2018, I will continue to walk for 30 minutes 5 days per week.      Patient Stated     04/07/2019, I will maintain and continue medications as prescribed.      Patient Stated     07/21/2020, I will maintain and continue medications as prescribed.      Patient Stated     08/11/2021, stay active        This is a list of the screening recommended for you and due dates:  Health Maintenance  Topic Date Due   COVID-19 Vaccine (5 - 2023-24 season) 12/08/2021   Flu Shot  11/08/2022   DTaP/Tdap/Td vaccine (2 - Td or Tdap) 02/18/2023   DEXA scan (bone density measurement)  05/18/2023   Medicare Annual Wellness Visit  09/25/2023   Pneumonia Vaccine  Completed   Zoster (Shingles) Vaccine  Completed   HPV Vaccine  Aged Out   Hepatitis C Screening  Discontinued    Advanced directives: Please bring a copy of your health care power of attorney and living will to the office to be added to your chart at your convenience.   Conditions/risks identified: Aim for 30 minutes of exercise or brisk walking, 6-8 glasses of water, and 5 servings of fruits and vegetables each day.   Next appointment: Follow up in one year for your annual wellness visit 09/26/23 10:45 telephone   Preventive Care 65 Years and Older, Female Preventive care refers to lifestyle choices and visits with your health care provider that can promote health and wellness. What does preventive care include? A yearly physical exam. This is also called an annual well check. Dental exams once or twice a year. Routine eye exams. Ask your health care provider how often you should have your eyes checked. Personal  lifestyle choices, including: Daily care of your teeth and gums. Regular physical activity. Eating a healthy diet. Avoiding tobacco and drug use. Limiting alcohol use. Practicing safe sex. Taking low-dose aspirin every day. Taking vitamin and mineral supplements as recommended by your health care provider. What happens during an annual well check? The services and screenings done by your health care provider during your annual well check will depend on your age, overall health, lifestyle risk factors, and family history of disease. Counseling  Your health care provider may ask you questions about your: Alcohol use. Tobacco use. Drug use. Emotional well-being. Home and relationship well-being. Sexual activity. Eating habits. History of falls. Memory and ability to understand (cognition). Work and work Astronomer. Reproductive health. Screening  You may have the following tests or measurements: Height, weight, and BMI. Blood pressure. Lipid and cholesterol levels. These may be checked every 5 years, or more frequently if you are over 69 years old. Skin check. Lung cancer screening. You may have this screening every year starting at age 82 if you have a 30-pack-year history of smoking and currently smoke or have quit within the past 15 years. Fecal occult blood test (FOBT) of the stool. You may have this test every year starting at age 40. Flexible sigmoidoscopy or colonoscopy. You may have a sigmoidoscopy every 5  years or a colonoscopy every 10 years starting at age 39. Hepatitis C blood test. Hepatitis B blood test. Sexually transmitted disease (STD) testing. Diabetes screening. This is done by checking your blood sugar (glucose) after you have not eaten for a while (fasting). You may have this done every 1-3 years. Bone density scan. This is done to screen for osteoporosis. You may have this done starting at age 26. Mammogram. This may be done every 82 years. Talk to your  health care provider about how often you should have regular mammograms. Talk with your health care provider about your test results, treatment options, and if necessary, the need for more tests. Vaccines  Your health care provider may recommend certain vaccines, such as: Influenza vaccine. This is recommended every year. Tetanus, diphtheria, and acellular pertussis (Tdap, Td) vaccine. You may need a Td booster every 10 years. Zoster vaccine. You may need this after age 82. Pneumococcal 13-valent conjugate (PCV13) vaccine. One dose is recommended after age 82. Pneumococcal polysaccharide (PPSV23) vaccine. One dose is recommended after age 82. Talk to your health care provider about which screenings and vaccines you need and how often you need them. This information is not intended to replace advice given to you by your health care provider. Make sure you discuss any questions you have with your health care provider. Document Released: 04/22/2015 Document Revised: 12/14/2015 Document Reviewed: 01/25/2015 Elsevier Interactive Patient Education  2017 [place] Prevention in the Home Falls can cause injuries. They can happen to people of all ages. There are many things you can do to make your home safe and to help prevent falls. What can I do on the outside of my home? Regularly fix the edges of walkways and driveways and fix any cracks. Remove anything that might make you trip as you walk through a door, such as a raised step or threshold. Trim any bushes or trees on the path to your home. Use bright outdoor lighting. Clear any walking paths of anything that might make someone trip, such as rocks or tools. Regularly check to see if handrails are loose or broken. Make sure that both sides of any steps have handrails. Any raised decks and porches should have guardrails on the edges. Have any leaves, snow, or ice cleared regularly. Use sand or salt on walking paths during winter. Clean  up any spills in your garage right away. This includes oil or grease spills. What can I do in the bathroom? Use night lights. Install grab bars by the toilet and in the tub and shower. Do not use towel bars as grab bars. Use non-skid mats or decals in the tub or shower. If you need to sit down in the shower, use a plastic, non-slip stool. Keep the floor dry. Clean up any water that spills on the floor as soon as it happens. Remove soap buildup in the tub or shower regularly. Attach bath mats securely with double-sided non-slip rug tape. Do not have throw rugs and other things on the floor that can make you trip. What can I do in the bedroom? Use night lights. Make sure that you have a light by your bed that is easy to reach. Do not use any sheets or blankets that are too big for your bed. They should not hang down onto the floor. Have a firm chair that has side arms. You can use this for support while you get dressed. Do not have throw rugs and other things on the  floor that can make you trip. What can I do in the kitchen? Clean up any spills right away. Avoid walking on wet floors. Keep items that you use a lot in easy-to-reach places. If you need to reach something above you, use a strong step stool that has a grab bar. Keep electrical cords out of the way. Do not use floor polish or wax that makes floors slippery. If you must use wax, use non-skid floor wax. Do not have throw rugs and other things on the floor that can make you trip. What can I do with my stairs? Do not leave any items on the stairs. Make sure that there are handrails on both sides of the stairs and use them. Fix handrails that are broken or loose. Make sure that handrails are as long as the stairways. Check any carpeting to make sure that it is firmly attached to the stairs. Fix any carpet that is loose or worn. Avoid having throw rugs at the top or bottom of the stairs. If you do have throw rugs, attach them to the  floor with carpet tape. Make sure that you have a light switch at the top of the stairs and the bottom of the stairs. If you do not have them, ask someone to add them for you. What else can I do to help prevent falls? Wear shoes that: Do not have high heels. Have rubber bottoms. Are comfortable and fit you well. Are closed at the toe. Do not wear sandals. If you use a stepladder: Make sure that it is fully opened. Do not climb a closed stepladder. Make sure that both sides of the stepladder are locked into place. Ask someone to hold it for you, if possible. Clearly mark and make sure that you can see: Any grab bars or handrails. First and last steps. Where the edge of each step is. Use tools that help you move around (mobility aids) if they are needed. These include: Canes. Walkers. Scooters. Crutches. Turn on the lights when you go into a dark area. Replace any light bulbs as soon as they burn out. Set up your furniture so you have a clear path. Avoid moving your furniture around. If any of your floors are uneven, fix them. If there are any pets around you, be aware of where they are. Review your medicines with your doctor. Some medicines can make you feel dizzy. This can increase your chance of falling. Ask your doctor what other things that you can do to help prevent falls. This information is not intended to replace advice given to you by your health care provider. Make sure you discuss any questions you have with your health care provider. Document Released: 01/20/2009 Document Revised: 09/01/2015 Document Reviewed: 04/30/2014 Elsevier Interactive Patient Education  2017 [employer].

## 2022-10-16 DIAGNOSIS — H43393 Other vitreous opacities, bilateral: Secondary | ICD-10-CM | POA: Diagnosis not present

## 2022-10-16 DIAGNOSIS — H1045 Other chronic allergic conjunctivitis: Secondary | ICD-10-CM | POA: Diagnosis not present

## 2022-10-16 DIAGNOSIS — H43811 Vitreous degeneration, right eye: Secondary | ICD-10-CM | POA: Diagnosis not present

## 2022-10-16 DIAGNOSIS — D3132 Benign neoplasm of left choroid: Secondary | ICD-10-CM | POA: Diagnosis not present

## 2022-10-16 DIAGNOSIS — H2511 Age-related nuclear cataract, right eye: Secondary | ICD-10-CM | POA: Diagnosis not present

## 2022-10-16 DIAGNOSIS — H35372 Puckering of macula, left eye: Secondary | ICD-10-CM | POA: Diagnosis not present

## 2022-10-16 DIAGNOSIS — H04123 Dry eye syndrome of bilateral lacrimal glands: Secondary | ICD-10-CM | POA: Diagnosis not present

## 2022-10-16 DIAGNOSIS — H0288A Meibomian gland dysfunction right eye, upper and lower eyelids: Secondary | ICD-10-CM | POA: Diagnosis not present

## 2022-10-16 DIAGNOSIS — H0288B Meibomian gland dysfunction left eye, upper and lower eyelids: Secondary | ICD-10-CM | POA: Diagnosis not present

## 2022-12-14 DIAGNOSIS — H25811 Combined forms of age-related cataract, right eye: Secondary | ICD-10-CM | POA: Diagnosis not present

## 2023-01-29 DIAGNOSIS — H2512 Age-related nuclear cataract, left eye: Secondary | ICD-10-CM | POA: Diagnosis not present

## 2023-02-01 ENCOUNTER — Other Ambulatory Visit: Payer: Self-pay | Admitting: Primary Care

## 2023-02-01 DIAGNOSIS — E785 Hyperlipidemia, unspecified: Secondary | ICD-10-CM

## 2023-02-01 DIAGNOSIS — H25812 Combined forms of age-related cataract, left eye: Secondary | ICD-10-CM | POA: Diagnosis not present

## 2023-03-26 ENCOUNTER — Other Ambulatory Visit: Payer: Self-pay | Admitting: *Deleted

## 2023-03-26 DIAGNOSIS — I6529 Occlusion and stenosis of unspecified carotid artery: Secondary | ICD-10-CM

## 2023-03-28 ENCOUNTER — Other Ambulatory Visit: Payer: Self-pay | Admitting: Primary Care

## 2023-03-28 DIAGNOSIS — I10 Essential (primary) hypertension: Secondary | ICD-10-CM

## 2023-04-01 ENCOUNTER — Ambulatory Visit (HOSPITAL_COMMUNITY): Payer: Medicare Other

## 2023-04-01 ENCOUNTER — Ambulatory Visit: Payer: Medicare Other

## 2023-04-07 ENCOUNTER — Other Ambulatory Visit: Payer: Self-pay | Admitting: Primary Care

## 2023-04-07 DIAGNOSIS — E785 Hyperlipidemia, unspecified: Secondary | ICD-10-CM

## 2023-05-03 DIAGNOSIS — Z1231 Encounter for screening mammogram for malignant neoplasm of breast: Secondary | ICD-10-CM | POA: Diagnosis not present

## 2023-05-03 LAB — HM MAMMOGRAPHY

## 2023-05-06 ENCOUNTER — Encounter: Payer: Self-pay | Admitting: Family Medicine

## 2023-05-10 DIAGNOSIS — R922 Inconclusive mammogram: Secondary | ICD-10-CM | POA: Diagnosis not present

## 2023-05-10 DIAGNOSIS — R92322 Mammographic fibroglandular density, left breast: Secondary | ICD-10-CM | POA: Diagnosis not present

## 2023-05-16 NOTE — Progress Notes (Signed)
 History of Present Illness:  Patient is a 83 y.o. year old female who presents for evaluation of carotid stenosis.  She is followed yearly for asymptomatic carotid surveillance.  The patient denies symptoms of TIA, amaurosis, aphasia, or other stroke symptoms.    She has no new complaints.  She states her son has started her on Squats daily and she does 90 a day.  She has no history of CAD, but her mother and all her mother siblings dies from MI.  Her sister was recently diagnosed with leukemia and she is helping care for her.    She is medically managed on ASA, and Stain daily.     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%   Diverticulosis    per colonoscopy   Heart murmur    Hyperlipidemia    Hypertension     Past Surgical History:  Procedure Laterality Date   ABDOMINAL HYSTERECTOMY  1982   APPENDECTOMY  2008     Social History Social History   Tobacco Use   Smoking status: Former    Current packs/day: 0.00    Types: Cigarettes    Quit date: 01/25/1974    Years since quitting: 49.3    Passive exposure: Never   Smokeless tobacco: Never  Vaping Use   Vaping status: Never Used  Substance Use Topics   Alcohol use: Yes    Alcohol/week: 1.0 standard drink of alcohol    Types: 1 Glasses of wine per week   Drug use: No    Family History Family History  Problem Relation Age of Onset   Heart disease Mother    Hyperlipidemia Mother    Hypertension Mother    Heart attack Mother    Lung cancer Brother     Allergies  Allergies  Allergen Reactions   Penicillins Hives     Current Outpatient Medications  Medication Sig Dispense Refill   amLODipine  (NORVASC ) 5 MG tablet TAKE 1 TABLET BY MOUTH DAILY FOR BLOOD PRESSURE 90 tablet 3   aspirin 81 MG tablet Take 81 mg by mouth daily.     Calcium Carbonate-Vitamin D  (CALCIUM-VITAMIN D3 PO) Take 1,000 mg by mouth daily.     Cholecalciferol (VITAMIN D3) 125 MCG (5000  UT) CAPS Take 1 capsule by mouth 3 (three) times a week.     irbesartan  (AVAPRO ) 150 MG tablet TAKE 1 TABLET BY MOUTH ONCE DAILY FOR BLOOD PRESSURE. 90 tablet 3   loratadine (CLARITIN) 10 MG tablet Take 10 mg by mouth daily.     lovastatin  (MEVACOR ) 20 MG tablet TAKE 1 TABLET BY MOUTH AT  BEDTIME FOR CHOLESTEROL 90 tablet 2   meloxicam  (MOBIC ) 15 MG tablet Take 1 tablet (15 mg total) by mouth daily. As needed for pain. 30 tablet 0   Multiple Vitamin (MULTIVITAMIN) tablet Take 1 tablet by mouth daily.     Omega-3 Fatty Acids (FISH OIL) 1000 MG CAPS Take 1 capsule by mouth daily.     tiZANidine  (ZANAFLEX ) 4 MG tablet Take 1 tablet (4 mg total) by mouth at bedtime as needed for muscle spasms. 30 tablet 0   diclofenac  Sodium (VOLTAREN ) 1 % GEL Apply 2 g topically 3 (three) times daily as needed. (Patient not taking: Reported on 05/17/2023) 100 g 0   No current facility-administered medications for this visit.    ROS:   General:  No weight loss, Fever, chills  HEENT: No  recent headaches, no nasal bleeding, no visual changes, no sore throat  Neurologic: No dizziness, blackouts, seizures. No recent symptoms of stroke or mini- stroke. No recent episodes of slurred speech, or temporary blindness.  Cardiac: No recent episodes of chest pain/pressure, no shortness of breath at rest.  No shortness of breath with exertion.  Denies history of atrial fibrillation or irregular heartbeat  Vascular: No history of rest pain in feet.  No history of claudication.  No history of non-healing ulcer, No history of DVT   Pulmonary: No home oxygen, no productive cough, no hemoptysis,  No asthma or wheezing  Musculoskeletal:  [ ]  Arthritis, [ ]  Low back pain,  [ ]  Joint pain  Hematologic:No history of hypercoagulable state.  No history of easy bleeding.  No history of anemia  Gastrointestinal: No hematochezia or melena,  No gastroesophageal reflux, no trouble swallowing  Urinary: [ ]  chronic Kidney disease, [ ]  on  HD - [ ]  MWF or [ ]  TTHS, [ ]  Burning with urination, [ ]  Frequent urination, [ ]  Difficulty urinating;   Skin: No rashes  Psychological: No history of anxiety,  No history of depression   Physical Examination  Vitals:   05/17/23 0833 05/17/23 0836  BP: 132/71 123/71  Pulse: 63   Resp: 18   Temp: 97.6 F (36.4 C)   TempSrc: Temporal   SpO2: 97%   Weight: 153 lb 8 oz (69.6 kg)   Height: 5' 7 (1.702 m)     Body mass index is 24.04 kg/m.  General:  Alert and oriented, no acute distress HEENT: Normal Neck: No bruit or JVD Pulmonary: Clear to auscultation bilaterally Cardiac: Regular Rate and Rhythm without murmur Gastrointestinal: Soft, non-tender, non-distended, no mass, no scars Skin: No rash Extremity Pulses:   radial pulses bilaterally Musculoskeletal: No deformity or edema  Neurologic: Upper and lower extremity motor 5/5 and symmetric  DATA:  Right Carotid Findings:  +----------+--------+--------+--------+------------------+--------+           PSV cm/sEDV cm/sStenosisPlaque DescriptionComments  +----------+--------+--------+--------+------------------+--------+  CCA Prox  86      13                                          +----------+--------+--------+--------+------------------+--------+  CCA Mid   77      16                                          +----------+--------+--------+--------+------------------+--------+  CCA Distal58      15                                          +----------+--------+--------+--------+------------------+--------+  ICA Prox  58      18      1-39%   heterogenous                +----------+--------+--------+--------+------------------+--------+  ICA Mid   77      22                                          +----------+--------+--------+--------+------------------+--------+  ICA Distal70  25                                           +----------+--------+--------+--------+------------------+--------+  ECA      81      13                                          +----------+--------+--------+--------+------------------+--------+   +----------+--------+-------+----------------+-------------------+           PSV cm/sEDV cmsDescribe        Arm Pressure (mmHG)  +----------+--------+-------+----------------+-------------------+  Subclavian110           Multiphasic, WNL                     +----------+--------+-------+----------------+-------------------+   +---------+--------+--+--------+-+---------+  VertebralPSV cm/s36EDV cm/s8Antegrade  +---------+--------+--+--------+-+---------+      Left Carotid Findings:  +----------+--------+--------+--------+------------------+--------+           PSV cm/sEDV cm/sStenosisPlaque DescriptionComments  +----------+--------+--------+--------+------------------+--------+  CCA Prox  112     24                                          +----------+--------+--------+--------+------------------+--------+  CCA Mid   88      23                                          +----------+--------+--------+--------+------------------+--------+  CCA Distal70      21              heterogenous                +----------+--------+--------+--------+------------------+--------+  ICA Prox  180     58      40-59%  heterogenous                +----------+--------+--------+--------+------------------+--------+  ICA Mid   105     37                                          +----------+--------+--------+--------+------------------+--------+  ICA Distal63      26                                          +----------+--------+--------+--------+------------------+--------+  ECA      85      18                                          +----------+--------+--------+--------+------------------+--------+    +----------+--------+--------+----------------+-------------------+           PSV cm/sEDV cm/sDescribe        Arm Pressure (mmHG)  +----------+--------+--------+----------------+-------------------+  [id number]             Multiphasic, WNL                     +----------+--------+--------+----------------+-------------------+   +---------+--------+--+--------+--+---------+  VertebralPSV cm/s41EDV cm/s10Antegrade  +---------+--------+--+--------+--+---------+         Summary:  Right Carotid: Velocities in the right ICA are consistent with a 1-39%  stenosis.   Left Carotid: Velocities in the left ICA are consistent with a 40-59%  stenosis.   Vertebrals: Bilateral vertebral arteries demonstrate antegrade flow.  Subclavians: Normal flow hemodynamics were seen in bilateral subclavian               arteries.    ASSESSMENT/PLAN: Asymptomatic carotid stenosis The duplex is unchanged and she remains asymptomatic for amaurosis, weakness, or aphasia.  She is medically managed and is staying active.  She will f/u in 1 year for repeat duplex.  If she develops symptoms of stroke she will call 911.      Maurilio Deland Collet PA-C [facility] Office: [phone]  MD in clinic [place]

## 2023-05-17 ENCOUNTER — Ambulatory Visit (HOSPITAL_COMMUNITY)
Admission: RE | Admit: 2023-05-17 | Discharge: 2023-05-17 | Disposition: A | Discharge status: Home / Self Care | Payer: Medicare Other | Source: Ambulatory Visit | Attending: Physician Assistant | Admitting: Physician Assistant

## 2023-05-17 ENCOUNTER — Ambulatory Visit: Payer: Medicare Other | Admitting: Physician Assistant

## 2023-05-17 VITALS — BP 123/71 | HR 63 | Temp 97.6°F | Resp 18 | Ht 67.0 in | Wt 153.5 lb

## 2023-05-17 DIAGNOSIS — I6529 Occlusion and stenosis of unspecified carotid artery: Secondary | ICD-10-CM

## 2023-05-24 ENCOUNTER — Other Ambulatory Visit: Payer: Self-pay | Admitting: Primary Care

## 2023-05-24 DIAGNOSIS — I10 Essential (primary) hypertension: Secondary | ICD-10-CM

## 2023-07-03 ENCOUNTER — Ambulatory Visit (INDEPENDENT_AMBULATORY_CARE_PROVIDER_SITE_OTHER): Payer: Medicare Other | Admitting: Primary Care

## 2023-07-03 ENCOUNTER — Encounter: Payer: Self-pay | Admitting: Primary Care

## 2023-07-03 VITALS — BP 128/70 | HR 75 | Temp 97.5°F | Ht 67.0 in | Wt 151.0 lb

## 2023-07-03 DIAGNOSIS — Z23 Encounter for immunization: Secondary | ICD-10-CM

## 2023-07-03 DIAGNOSIS — M542 Cervicalgia: Secondary | ICD-10-CM

## 2023-07-03 DIAGNOSIS — G8929 Other chronic pain: Secondary | ICD-10-CM | POA: Diagnosis not present

## 2023-07-03 DIAGNOSIS — I6523 Occlusion and stenosis of bilateral carotid arteries: Secondary | ICD-10-CM

## 2023-07-03 DIAGNOSIS — R7303 Prediabetes: Secondary | ICD-10-CM

## 2023-07-03 DIAGNOSIS — M545 Low back pain, unspecified: Secondary | ICD-10-CM

## 2023-07-03 DIAGNOSIS — I10 Essential (primary) hypertension: Secondary | ICD-10-CM | POA: Diagnosis not present

## 2023-07-03 DIAGNOSIS — E785 Hyperlipidemia, unspecified: Secondary | ICD-10-CM

## 2023-07-03 DIAGNOSIS — Z0000 Encounter for general adult medical examination without abnormal findings: Secondary | ICD-10-CM | POA: Diagnosis not present

## 2023-07-03 DIAGNOSIS — E2839 Other primary ovarian failure: Secondary | ICD-10-CM

## 2023-07-03 DIAGNOSIS — M150 Primary generalized (osteo)arthritis: Secondary | ICD-10-CM

## 2023-07-03 LAB — COMPREHENSIVE METABOLIC PANEL
ALT: 27 U/L (ref 0–35)
AST: 18 U/L (ref 0–37)
Albumin: 4.6 g/dL (ref 3.5–5.2)
Alkaline Phosphatase: 69 U/L (ref 39–117)
BUN: 17 mg/dL (ref 6–23)
CO2: 30 meq/L (ref 19–32)
Calcium: 10.1 mg/dL (ref 8.4–10.5)
Chloride: 102 meq/L (ref 96–112)
Creatinine, Ser: 0.84 mg/dL (ref 0.40–1.20)
GFR: 64.68 mL/min (ref >60.00)
Glucose, Bld: 102 mg/dL — ABNORMAL HIGH (ref 70–99)
Potassium: 4.4 meq/L (ref 3.5–5.1)
Sodium: 140 meq/L (ref 135–145)
Total Bilirubin: 0.7 mg/dL (ref 0.2–1.2)
Total Protein: 7.2 g/dL (ref 6.0–8.3)

## 2023-07-03 LAB — CBC
HCT: 40.4 % (ref 36.0–46.0)
Hemoglobin: 13.4 g/dL (ref 12.0–15.0)
MCHC: 33.3 g/dL (ref 30.0–36.0)
MCV: 92.3 fl (ref 78.0–100.0)
Platelets: 329 10*3/uL (ref 150.0–400.0)
RBC: 4.37 Mil/uL (ref 3.87–5.11)
RDW: 13.5 % (ref 11.5–15.5)
WBC: 8.2 10*3/uL (ref 4.0–10.5)

## 2023-07-03 LAB — LIPID PANEL
Cholesterol: 177 mg/dL (ref 0–200)
HDL: 63.2 mg/dL (ref >39.00)
LDL Cholesterol: 83 mg/dL (ref 0–99)
NonHDL: 114.26
Total CHOL/HDL Ratio: 3
Triglycerides: 155 mg/dL — ABNORMAL HIGH (ref 0.0–149.0)
VLDL: 31 mg/dL (ref 0.0–40.0)

## 2023-07-03 LAB — HEMOGLOBIN A1C: Hgb A1c MFr Bld: 6.1 % (ref 4.6–6.5)

## 2023-07-03 NOTE — Assessment & Plan Note (Addendum)
 Stable.   Commended her on regular exercise and strength training.  Continue Tylenol PRN

## 2023-07-03 NOTE — Assessment & Plan Note (Signed)
 Prevnar 20 provided today.  Mammogram UTD. Bone density scan due, orders placed.   Discussed the importance of a healthy diet and regular exercise in order for weight loss, and to reduce the risk of further co-morbidity.  Exam stable. Labs pending.  Follow up in 1 year for repeat physical.

## 2023-07-03 NOTE — Assessment & Plan Note (Signed)
Continue lovastatin 20 mg daily. Repeat lipid panel pending.

## 2023-07-03 NOTE — Assessment & Plan Note (Signed)
 Stable.   Continue tizanidine 4 mg PRN and Meloxicam 15 mg PRN for which she uses sparingly.

## 2023-07-03 NOTE — Progress Notes (Signed)
 Subjective:    Patient ID: Rachel Bowers, female    DOB: 1940-08-19, 83 y.o.   MRN: [id number]  HPI  Rachel Bowers is a very pleasant 83 y.o. female who presents today for complete physical and follow up of chronic conditions.  Immunizations: -Tetanus: Completed in 2025 -Influenza: Completed last season  -Shingles: Completed Shingrix series -Pneumonia: Completed Prevnar 13 in 2015, pneumovax 23 in 2009.   Diet: Fair diet.  Exercise: Regular exercise.  Eye exam: Completes annually  Dental exam: Completes semi-annually    Mammogram: Completed in January 2025 Bone Density Scan: Completed in February 2023  Colonoscopy: Completed in 2016. No further screening given age.  BP Readings from Last 3 Encounters:  07/03/23 128/70  05/17/23 123/71  06/27/22 102/60         Review of Systems  Constitutional:  Negative for unexpected weight change.  HENT:  Negative for rhinorrhea.   Respiratory:  Negative for cough and shortness of breath.   Cardiovascular:  Negative for chest pain.  Gastrointestinal:  Negative for constipation and diarrhea.  Genitourinary:  Negative for difficulty urinating.  Musculoskeletal:  Negative for arthralgias and myalgias.  Skin:  Negative for rash.  Allergic/Immunologic: Negative for environmental allergies.  Neurological:  Negative for dizziness and headaches.  Psychiatric/Behavioral:  The patient is nervous/anxious.          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%   Diverticulosis    per colonoscopy   Heart murmur    Hyperlipidemia    Hypertension     Social History   Socioeconomic History   Marital status: Married    Spouse name: Not on file   Number of children: Not on file   Years of education: Not on file   Highest education level: Not on file  Occupational History   Occupation: retired  Tobacco Use   Smoking status: Former    Current packs/day: 0.00    Types: Cigarettes     Quit date: 01/25/1974    Years since quitting: 49.4    Passive exposure: Never   Smokeless tobacco: Never  Vaping Use   Vaping status: Never Used  Substance and Sexual Activity   Alcohol use: Yes    Alcohol/week: 1.0 standard drink of alcohol    Types: 1 Glasses of wine per week   Drug use: No   Sexual activity: Not on file  Other Topics Concern   Not on file  Social History Narrative   Married.   1 daughter and 1 son. 2 grandchildren.   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   Enjoys exercising at the [facility], reading.    Social Drivers of Corporate investment banker Strain: Low Risk  (09/25/2022)   Overall Financial Resource Strain (CARDIA)    Difficulty of Paying Living Expenses: Not hard at all  Food Insecurity: No Food Insecurity (09/25/2022)   Hunger Vital Sign    Worried About Running Out of Food in the Last Year: Never true    Ran Out of Food in the Last Year: Never true  Transportation Needs: No Transportation Needs (09/25/2022)   PRAPARE - Administrator, Civil Service (Medical): No    Lack of Transportation (Non-Medical): No  Physical Activity: Sufficiently Active (09/25/2022)   Exercise Vital Sign    Days of Exercise per Week: 3 days    Minutes of  Exercise per Session: 50 min  Stress: No Stress Concern Present (09/25/2022)   [employer] of Occupational Health - Occupational Stress Questionnaire    Feeling of Stress : Not at all  Social Connections: Moderately Integrated (09/25/2022)   Social Connection and Isolation Panel [NHANES]    Frequency of Communication with Friends and Family: More than three times a week    Frequency of Social Gatherings with Friends and Family: More than three times a week    Attends Religious Services: More than 4 times per year    Active Member of [employer] or Organizations: Yes    Attends Banker Meetings: More than 4 times per year    Marital Status: Widowed   Intimate Partner Violence: Not At Risk (09/25/2022)   Humiliation, Afraid, Rape, and Kick questionnaire    Fear of Current or Ex-Partner: No    Emotionally Abused: No    Physically Abused: No    Sexually Abused: No    Past Surgical History:  Procedure Laterality Date   ABDOMINAL HYSTERECTOMY  04/09/1980   APPENDECTOMY  04/09/2006   CATARACT EXTRACTION Bilateral 2024    Family History  Problem Relation Age of Onset   Heart disease Mother    Hyperlipidemia Mother    Hypertension Mother    Heart attack Mother    Lung cancer Brother     Allergies  Allergen Reactions   Penicillins Hives    Current Outpatient Medications on File Prior to Visit  Medication Sig Dispense Refill   amLODipine (NORVASC) 5 MG tablet TAKE 1 TABLET BY MOUTH DAILY FOR BLOOD PRESSURE 90 tablet 3   aspirin 81 MG tablet Take 81 mg by mouth daily.     Calcium Carbonate-Vitamin D (CALCIUM-VITAMIN D3 PO) Take 500 mg by mouth daily.     Cholecalciferol (VITAMIN D3) 125 MCG (5000 UT) CAPS Take 1 capsule by mouth 3 (three) times a week.     irbesartan (AVAPRO) 150 MG tablet TAKE 1 TABLET BY MOUTH ONCE DAILY FOR BLOOD PRESSURE. 90 tablet 3   loratadine (CLARITIN) 10 MG tablet Take 10 mg by mouth daily.     lovastatin (MEVACOR) 20 MG tablet TAKE 1 TABLET BY MOUTH AT  BEDTIME FOR CHOLESTEROL 90 tablet 2   meloxicam (MOBIC) 15 MG tablet Take 1 tablet (15 mg total) by mouth daily. As needed for pain. 30 tablet 0   Multiple Vitamin (MULTIVITAMIN) tablet Take 1 tablet by mouth daily.     tiZANidine (ZANAFLEX) 4 MG tablet Take 1 tablet (4 mg total) by mouth at bedtime as needed for muscle spasms. 30 tablet 0   diclofenac Sodium (VOLTAREN) 1 % GEL Apply 2 g topically 3 (three) times daily as needed. (Patient not taking: Reported on 07/03/2023) 100 g 0   No current facility-administered medications on file prior to visit.    BP 128/70   Pulse 75   Temp (!) 97.5 F (36.4 C) (Temporal)   Ht 5\' 7"  (1.702 m)   Wt 151 lb  (68.5 kg)   SpO2 96%   BMI 23.65 kg/m  Objective:   Physical Exam HENT:     Right Ear: Tympanic membrane and ear canal normal.     Left Ear: Tympanic membrane and ear canal normal.  Eyes:     Pupils: Pupils are equal, round, and reactive to light.  Cardiovascular:     Rate and Rhythm: Normal rate and regular rhythm.  Pulmonary:     Effort: Pulmonary effort is normal.  Breath sounds: Normal breath sounds.  Abdominal:     General: Bowel sounds are normal.     Palpations: Abdomen is soft.     Tenderness: There is no abdominal tenderness.  Musculoskeletal:        General: Normal range of motion.     Cervical back: Neck supple.  Skin:    General: Skin is warm and dry.  Neurological:     Mental Status: She is alert and oriented to person, place, and time.     Cranial Nerves: No cranial nerve deficit.     Deep Tendon Reflexes:     Reflex Scores:      Patellar reflexes are 2+ on the right side and 2+ on the left side. Psychiatric:        Mood and Affect: Mood normal.           Assessment & Plan:  Preventative health care Assessment & Plan: Prevnar 20 provided today.  Mammogram UTD. Bone density scan due, orders placed.   Discussed the importance of a healthy diet and regular exercise in order for weight loss, and to reduce the risk of further co-morbidity.  Exam stable. Labs pending.  Follow up in 1 year for repeat physical.    Bilateral carotid artery stenosis Assessment & Plan: Asymptomatic.   Following with vascular services, reviewed office notes from February 2025. Continue lovastatin 20 mg daily and aspirin 81 mg daily.    Essential hypertension Assessment & Plan: Controlled.  Continue amlodipine 5 mg daily, irbesartan 150 mg daily. CMP pending.  Orders: -     CBC  Primary osteoarthritis involving multiple joints Assessment & Plan: Stable.   Commended her on regular exercise and strength training.  Continue Tylenol PRN   Chronic  bilateral low back pain without sciatica Assessment & Plan: Controlled.  Continue Meloxicam 15 mg PRN and Tizanidine 4 mg PRN. She uses sparingly.    Chronic neck pain Assessment & Plan: Stable.   Continue tizanidine 4 mg PRN and Meloxicam 15 mg PRN for which she uses sparingly.    Hyperlipidemia, unspecified hyperlipidemia type Assessment & Plan: Continue lovastatin 20 mg daily. Repeat lipid panel pending.   Orders: -     Lipid panel -     Comprehensive metabolic panel  Prediabetes Assessment & Plan: Commended her on regular exercise. Repeat A1C pending.  Orders: -     Hemoglobin A1c  Estrogen deficiency -     DG Bone Density; Future        Doreene Nest, NP

## 2023-07-03 NOTE — Assessment & Plan Note (Addendum)
Controlled.  ? ?Continue amlodipine 5 mg daily, irbesartan 150 mg daily.  ?CMP pending.  ?

## 2023-07-03 NOTE — Patient Instructions (Signed)
Stop by the lab prior to leaving today. I will notify you of your results once received.   [facility] to schedule your bone density scan.   It was a pleasure to see you today!

## 2023-07-03 NOTE — Assessment & Plan Note (Signed)
 Controlled.  Continue Meloxicam 15 mg PRN and Tizanidine 4 mg PRN. She uses sparingly.

## 2023-07-03 NOTE — Assessment & Plan Note (Signed)
Commended her on regular exercise.  Repeat A1C pending. 

## 2023-07-03 NOTE — Assessment & Plan Note (Signed)
 Asymptomatic.   Following with vascular services, reviewed office notes from February 2025. Continue lovastatin 20 mg daily and aspirin 81 mg daily.

## 2023-07-18 ENCOUNTER — Other Ambulatory Visit: Payer: Self-pay | Admitting: Primary Care

## 2023-07-18 DIAGNOSIS — I10 Essential (primary) hypertension: Secondary | ICD-10-CM

## 2023-07-23 ENCOUNTER — Telehealth: Payer: Self-pay

## 2023-07-23 NOTE — Telephone Encounter (Signed)
 Copied from CRM [id number]. Topic: General - Other >> Jul 23, 2023  9:04 AM Turkey A wrote: Reason for CRM: Patient called because she has a Bone Density Test at [facility] [phone] and needs an Order to be sent to them. The appointment is on 08/06/23. There was no fax number provided

## 2023-07-23 NOTE — Telephone Encounter (Signed)
 Bone density order faxed to [facility] as requested.

## 2023-08-02 DIAGNOSIS — L820 Inflamed seborrheic keratosis: Secondary | ICD-10-CM | POA: Diagnosis not present

## 2023-08-02 DIAGNOSIS — L821 Other seborrheic keratosis: Secondary | ICD-10-CM | POA: Diagnosis not present

## 2023-08-02 DIAGNOSIS — L814 Other melanin hyperpigmentation: Secondary | ICD-10-CM | POA: Diagnosis not present

## 2023-08-02 DIAGNOSIS — D225 Melanocytic nevi of trunk: Secondary | ICD-10-CM | POA: Diagnosis not present

## 2023-08-06 DIAGNOSIS — M810 Age-related osteoporosis without current pathological fracture: Secondary | ICD-10-CM | POA: Diagnosis not present

## 2023-08-06 LAB — HM DEXA SCAN

## 2023-08-09 ENCOUNTER — Encounter: Payer: Self-pay | Admitting: Family Medicine

## 2023-09-23 ENCOUNTER — Encounter: Payer: Self-pay | Admitting: Emergency Medicine

## 2023-09-23 ENCOUNTER — Ambulatory Visit
Admission: EM | Admit: 2023-09-23 | Discharge: 2023-09-23 | Disposition: A | Discharge status: Home / Self Care | Attending: Physician Assistant | Admitting: Physician Assistant

## 2023-09-23 ENCOUNTER — Telehealth: Payer: Self-pay

## 2023-09-23 ENCOUNTER — Ambulatory Visit

## 2023-09-23 DIAGNOSIS — S99922A Unspecified injury of left foot, initial encounter: Secondary | ICD-10-CM | POA: Diagnosis not present

## 2023-09-23 DIAGNOSIS — M19072 Primary osteoarthritis, left ankle and foot: Secondary | ICD-10-CM | POA: Diagnosis not present

## 2023-09-23 DIAGNOSIS — S9782XA Crushing injury of left foot, initial encounter: Secondary | ICD-10-CM | POA: Diagnosis not present

## 2023-09-23 NOTE — ED Triage Notes (Signed)
 Pt reports L foot injury ~1hr ago. They were clearing out a family member's house when a flat iron landed on her foot. Foot is bruised and swollen. Pt can move her toes but reports that she doesn't have much feeling in the toes.

## 2023-09-23 NOTE — Telephone Encounter (Signed)
 Pt came in office; pts was at her sister's home helping to clear out items(pt sister passed away on 2023-10-01) and taking things to [employer]; pt was taking something out of seat of car and 6 lb iron fell on side of lt foot (side of foot where little toe is) and slight abrasion and goose egg appeared right after iron fell on foot. Pt could not remember pain level when she walked in office?? I got pt a w/c and pt sat in w/c and covered ice pack was applied to area of swelling on lt foot. Pt said she had recent tetanus shot.. T 98.6 P 80 pulseox 95% and BP138/80 sitting lt arm reg cuff.. pt was concerned about how swollen lt foot was; no available appts at [facility] and pt lives near [facility] on [address]. Pt's son will drive pt to [facility] [place] now for evaluation and possible xrays. I assisted pt to car via W/C. Sending note to Dr Crissie Dome who is in office and Tresea Frost NP as PCP who is not in office.

## 2023-09-23 NOTE — Telephone Encounter (Signed)
Noted, will await UC notes 

## 2023-09-23 NOTE — ED Provider Notes (Signed)
 [facility]    CSN: [id number] Arrival date & time: 09/23/23  1541      History   Chief Complaint Chief Complaint  Patient presents with   Foot Injury    HPI Rachel Bowers is a 83 y.o. female.   Patient presents today for evaluation of injury to her left foot that occurred about an hour ago.  She reports that she was clearing out of family member's house when a flat iron landed on her foot.  She has developed bruising and swelling to the area but denies any consistent pain.  She notes she has some decree sensation to her toes.  She is not currently on a blood thinning medication.  The history is provided by the patient.  Foot Injury Associated symptoms: no fever     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%   Diverticulosis    per colonoscopy   Heart murmur    Hyperlipidemia    Hypertension     Patient Active Problem List   Diagnosis Date Noted   Caregiver role strain 06/02/2020   Chronic neck pain 10/07/2019   Prediabetes 04/09/2019   Allergic rhinitis 11/11/2018   Right ear pain 11/11/2018   Chronic lower back pain 03/31/2018   Osteoarthritis 03/28/2017   Preventative health care 03/28/2017   Vaginal dryness 03/28/2017   Vitamin D  deficiency 03/21/2016   Medicare annual wellness visit, subsequent 03/21/2016   Essential hypertension 03/14/2015   Hyperlipidemia 03/14/2015   Carotid stenosis 01/25/2014    Past Surgical History:  Procedure Laterality Date   ABDOMINAL HYSTERECTOMY  04/09/1980   APPENDECTOMY  04/09/2006   CATARACT EXTRACTION Bilateral 2024    OB History   No obstetric history on file.      Home Medications    Prior to Admission medications   Medication Sig Start Date End Date Taking? Authorizing Provider  amLODipine  (NORVASC ) 5 MG tablet TAKE 1 TABLET BY MOUTH DAILY FOR BLOOD PRESSURE 07/15/22  Yes Clark, Katherine K, NP  aspirin 81 MG tablet Take 81 mg by mouth daily.   Yes  [provider]  Calcium Carbonate-Vitamin D  (CALCIUM-VITAMIN D3 PO) Take 500 mg by mouth daily.   Yes [provider]  Cholecalciferol (VITAMIN D3) 125 MCG (5000 UT) CAPS Take 1 capsule by mouth 3 (three) times a week.   Yes [provider]  irbesartan  (AVAPRO ) 150 MG tablet TAKE 1 TABLET BY MOUTH ONCE DAILY FOR BLOOD PRESSURE. 07/18/23  Yes Clark, Katherine K, NP  loratadine (CLARITIN) 10 MG tablet Take 10 mg by mouth daily.   Yes [provider]  lovastatin  (MEVACOR ) 20 MG tablet TAKE 1 TABLET BY MOUTH AT  BEDTIME FOR CHOLESTEROL 08/24/22  Yes Clark, Katherine K, NP  Multiple Vitamin (MULTIVITAMIN) tablet Take 1 tablet by mouth daily.   Yes [provider]  diclofenac  Sodium (VOLTAREN ) 1 % GEL Apply 2 g topically 3 (three) times daily as needed. Patient not taking: Reported on 07/03/2023 10/07/19   Gabriel John, NP  meloxicam  (MOBIC ) 15 MG tablet Take 1 tablet (15 mg total) by mouth daily. As needed for pain. Patient not taking: Reported on 09/23/2023 06/27/22   Clark, Katherine K, NP  tiZANidine  (ZANAFLEX ) 4 MG tablet Take 1 tablet (4 mg total) by mouth at bedtime as needed for muscle spasms. Patient not taking: Reported on 09/23/2023 06/27/22   Gabriel John, NP    Family History  Family History  Problem Relation Age of Onset   Heart disease Mother    Hyperlipidemia Mother    Hypertension Mother    Heart attack Mother    Lung cancer Brother     Social History Social History   Tobacco Use   Smoking status: Former    Current packs/day: 0.00    Types: Cigarettes    Quit date: 01/25/1974    Years since quitting: 49.6    Passive exposure: Never   Smokeless tobacco: Never  Vaping Use   Vaping status: Never Used  Substance Use Topics   Alcohol use: Yes    Alcohol/week: 1.0 standard drink of alcohol    Types: 1 Glasses of wine per week   Drug use: No     Allergies   Penicillins   Review of Systems Review of Systems   Constitutional:  Negative for chills and fever.  Eyes:  Negative for discharge and redness.  Gastrointestinal:  Negative for abdominal pain, nausea and vomiting.  Musculoskeletal:  Negative for arthralgias and joint swelling.  Skin:  Positive for color change. Negative for wound.     Physical Exam Triage Vital Signs ED Triage Vitals [09/23/23 1552]  Encounter Vitals Group     BP 132/77     Girls Systolic BP Percentile      Girls Diastolic BP Percentile      Boys Systolic BP Percentile      Boys Diastolic BP Percentile      Pulse Rate 76     Resp 14     Temp 98.3 F (36.8 C)     Temp Source Oral     SpO2 96 %     Weight      Height      Head Circumference      Peak Flow      Pain Score 0     Pain Loc      Pain Education      Exclude from Growth Chart    No data found.  Updated Vital Signs BP 132/77 (BP Location: Left Arm)   Pulse 76   Temp 98.3 F (36.8 C) (Oral)   Resp 14   SpO2 96%   Visual Acuity Right Eye Distance:   Left Eye Distance:   Bilateral Distance:    Right Eye Near:   Left Eye Near:    Bilateral Near:     Physical Exam Vitals and nursing note reviewed.  Constitutional:      General: She is not in acute distress.    Appearance: Normal appearance. She is not ill-appearing.  HENT:     Head: Normocephalic and atraumatic.   Eyes:     Conjunctiva/sclera: Conjunctivae normal.    Cardiovascular:     Rate and Rhythm: Normal rate.  Pulmonary:     Effort: Pulmonary effort is normal. No respiratory distress.   Musculoskeletal:     Comments: Left foot with lateral proximal swelling and bruising.  Normal range of motion of left ankle and toes   Skin:    Capillary Refill: Normal cap refill to left toes  Neurological:     Mental Status: She is alert.     Comments: Gross sensation intact to distal left toes  Psychiatric:        Mood and Affect: Mood normal.        Behavior: Behavior normal.        Thought Content: Thought content normal.       UC Treatments /  Results  Labs (all labs ordered are listed, but only abnormal results are displayed) Labs Reviewed - No data to display  EKG   Radiology DG Foot Complete Left Result Date: 09/23/2023 CLINICAL DATA:  Foot injury EXAM: LEFT FOOT - COMPLETE 3+ VIEW COMPARISON:  None Available. FINDINGS: No acute fracture or malalignment. Irregular arthropathy at the second D IP joint. Elsewhere joint space narrowing at the PIP joints. Mild degenerative change at the first MTP joint. IMPRESSION: 1. No acute osseous abnormality. Electronically Signed   By: Esmeralda Hedge M.D.   On: 09/23/2023 16:56    Procedures Procedures (including critical care time)  Medications Ordered in UC Medications - No data to display  Initial Impression / Assessment and Plan / UC Course  I have reviewed the triage vital signs and the nursing notes.  Pertinent labs & imaging results that were available during my care of the patient were reviewed by me and considered in my medical decision making (see chart for details).    X-ray ordered without fracture.  Recommended elevation and ice to help with swelling and follow-up if symptoms do not continue to improve or worsen.  Final Clinical Impressions(s) / UC Diagnoses   Final diagnoses:  Foot injury, left, initial encounter   Discharge Instructions   None    ED Prescriptions   None    PDMP not reviewed this encounter.   Vernestine Gondola, PA-C 09/23/23 1947

## 2023-10-31 DIAGNOSIS — H43393 Other vitreous opacities, bilateral: Secondary | ICD-10-CM | POA: Diagnosis not present

## 2023-10-31 DIAGNOSIS — H35372 Puckering of macula, left eye: Secondary | ICD-10-CM | POA: Diagnosis not present

## 2023-10-31 DIAGNOSIS — H43811 Vitreous degeneration, right eye: Secondary | ICD-10-CM | POA: Diagnosis not present

## 2023-10-31 DIAGNOSIS — D3132 Benign neoplasm of left choroid: Secondary | ICD-10-CM | POA: Diagnosis not present

## 2023-10-31 DIAGNOSIS — Z961 Presence of intraocular lens: Secondary | ICD-10-CM | POA: Diagnosis not present

## 2023-11-29 ENCOUNTER — Ambulatory Visit

## 2023-12-03 ENCOUNTER — Ambulatory Visit (INDEPENDENT_AMBULATORY_CARE_PROVIDER_SITE_OTHER)

## 2023-12-03 VITALS — BP 132/77 | Ht 67.0 in | Wt 143.0 lb

## 2023-12-03 DIAGNOSIS — Z2821 Immunization not carried out because of patient refusal: Secondary | ICD-10-CM | POA: Diagnosis not present

## 2023-12-03 DIAGNOSIS — Z0000 Encounter for general adult medical examination without abnormal findings: Secondary | ICD-10-CM | POA: Diagnosis not present

## 2023-12-03 NOTE — Progress Notes (Signed)
 Because this visit was a virtual/telehealth visit,  certain criteria was not obtained, such a blood pressure, CBG if applicable, and timed get up and go. Any medications not marked as taking were not mentioned during the medication reconciliation part of the visit. Any vitals not documented were not able to be obtained due to this being a telehealth visit or patient was unable to self-report a recent blood pressure reading due to a lack of equipment at home via telehealth. Vitals that have been documented are verbally provided by the patient.   This visit was performed by a medical professional under my direct supervision. I was immediately available for consultation/collaboration. I have reviewed and agree with the Annual Wellness Visit documentation.  Subjective:   Rachel Bowers is a 83 y.o. who presents for a Medicare Wellness preventive visit.  As a reminder, Annual Wellness Visits don't include a physical exam, and some assessments may be limited, especially if this visit is performed virtually. We may recommend an in-person follow-up visit with your provider if needed.  Visit Complete: Virtual I connected with  Donia JONETTA Round on 12/03/23 by a audio enabled telemedicine application and verified that I am speaking with the correct person using two identifiers.  Patient Location: Home  Provider Location: Home Office  I discussed the limitations of evaluation and management by telemedicine. The patient expressed understanding and agreed to proceed.  Vital Signs: Because this visit was a virtual/telehealth visit, some criteria may be missing or patient reported. Any vitals not documented were not able to be obtained and vitals that have been documented are patient reported.  VideoDeclined- This patient declined Librarian, academic. Therefore the visit was completed with audio only.  Persons Participating in Visit: Patient.  AWV Questionnaire: No: Patient Medicare  AWV questionnaire was not completed prior to this visit.  Cardiac Risk Factors include: advanced age (>23men, >41 women);hypertension;dyslipidemia     Objective:    Today's Vitals   12/03/23 1432  BP: 132/77  Weight: 143 lb (64.9 kg)  Height: 5' 7 (1.702 m)   Body mass index is 22.4 kg/m.     12/03/2023    2:31 PM 09/25/2022    3:56 PM 08/11/2021    3:17 PM 07/21/2020    9:46 AM 04/07/2019   10:35 AM 03/28/2018   11:07 AM 03/27/2017   10:03 AM  Advanced Directives  Does Patient Have a Medical Advance Directive? Yes Yes Yes Yes Yes Yes  Yes   Type of Estate agent of [place];Living will Healthcare Power of [place];Living will Healthcare Power of [place];Living will Healthcare Power of [place];Living will Healthcare Power of [place];Living will Healthcare Power of [place];Living will Healthcare Power of [place];Living will  Does patient want to make changes to medical advance directive? No - Patient declined        Copy of Healthcare Power of Attorney in Chart? No - copy requested No - copy requested No - copy requested No - copy requested No - copy requested No - copy requested  No - copy requested      Data saved with a previous flowsheet row definition    Current Medications (verified) Outpatient Encounter Medications as of 12/03/2023  Medication Sig   amLODipine  (NORVASC ) 5 MG tablet TAKE 1 TABLET BY MOUTH DAILY FOR BLOOD PRESSURE   aspirin 81 MG tablet Take 81 mg by mouth daily.   Calcium Carbonate-Vitamin D  (CALCIUM-VITAMIN D3 PO) Take 500 mg by mouth daily.   Cholecalciferol (VITAMIN D3)  125 MCG (5000 UT) CAPS Take 1 capsule by mouth 3 (three) times a week.   irbesartan  (AVAPRO ) 150 MG tablet TAKE 1 TABLET BY MOUTH ONCE DAILY FOR BLOOD PRESSURE.   loratadine (CLARITIN) 10 MG tablet Take 10 mg by mouth daily.   lovastatin  (MEVACOR ) 20 MG tablet TAKE 1 TABLET BY MOUTH AT  BEDTIME FOR CHOLESTEROL   Multiple Vitamin (MULTIVITAMIN) tablet Take 1  tablet by mouth daily.   diclofenac  Sodium (VOLTAREN ) 1 % GEL Apply 2 g topically 3 (three) times daily as needed. (Patient not taking: Reported on 12/03/2023)   meloxicam  (MOBIC ) 15 MG tablet Take 1 tablet (15 mg total) by mouth daily. As needed for pain. (Patient not taking: Reported on 12/03/2023)   tiZANidine  (ZANAFLEX ) 4 MG tablet Take 1 tablet (4 mg total) by mouth at bedtime as needed for muscle spasms. (Patient not taking: Reported on 12/03/2023)   No facility-administered encounter medications on file as of 12/03/2023.    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%   Diverticulosis    per colonoscopy   Heart murmur    Hyperlipidemia    Hypertension    Past Surgical History:  Procedure Laterality Date   ABDOMINAL HYSTERECTOMY  04/09/1980   APPENDECTOMY  04/09/2006   CATARACT EXTRACTION Bilateral 2024   Family History  Problem Relation Age of Onset   Heart disease Mother    Hyperlipidemia Mother    Hypertension Mother    Heart attack Mother    Lung cancer Brother    Social History   Socioeconomic History   Marital status: Married    Spouse name: Not on file   Number of children: Not on file   Years of education: Not on file   Highest education level: Not on file  Occupational History   Occupation: retired  Tobacco Use   Smoking status: Former    Current packs/day: 0.00    Types: Cigarettes    Quit date: 01/25/1974    Years since quitting: 49.8    Passive exposure: Never   Smokeless tobacco: Never  Vaping Use   Vaping status: Never Used  Substance and Sexual Activity   Alcohol use: Yes    Alcohol/week: 1.0 standard drink of alcohol    Types: 1 Glasses of wine per week   Drug use: No   Sexual activity: Not on file  Other Topics Concern   Not on file  Social History Narrative   Married.   1 daughter and 1 son. 2 grandchildren.   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   Enjoys exercising at the [facility], reading.    Social Drivers of Corporate investment banker Strain: Low Risk  (12/03/2023)   Overall Financial Resource Strain (CARDIA)    Difficulty of Paying Living Expenses: Not hard at all  Food Insecurity: No Food Insecurity (12/03/2023)   Hunger Vital Sign    Worried About Running Out of Food in the Last Year: Never true    Ran Out of Food in the Last Year: Never true  Transportation Needs: No Transportation Needs (12/03/2023)   PRAPARE - Administrator, Civil Service (Medical): No    Lack of Transportation (Non-Medical): No  Physical Activity: Insufficiently Active (12/03/2023)   Exercise Vital Sign    Days of Exercise per Week: 7 days    Minutes  of Exercise per Session: 20 min  Stress: No Stress Concern Present (12/03/2023)   [employer] of Occupational Health - Occupational Stress Questionnaire    Feeling of Stress: Not at all  Social Connections: Moderately Integrated (12/03/2023)   Social Connection and Isolation Panel    Frequency of Communication with Friends and Family: More than three times a week    Frequency of Social Gatherings with Friends and Family: More than three times a week    Attends Religious Services: More than 4 times per year    Active Member of [employer] or Organizations: Yes    Attends Banker Meetings: More than 4 times per year    Marital Status: Widowed    Tobacco Counseling Counseling given: Not Answered    Clinical Intake:  Pre-visit preparation completed: Yes  Pain : No/denies pain     BMI - recorded: 22.4 Nutritional Status: BMI of 19-24  Normal Nutritional Risks: None Diabetes: No  Lab Results  Component Value Date   HGBA1C 6.1 07/03/2023   HGBA1C 6.2 06/27/2022   HGBA1C 6.2 06/14/2021     How often do you need to have someone help you when you read instructions, pamphlets, or other written materials from your doctor  or pharmacy?: 1 - Never  Interpreter Needed?: No  Information entered by :: Indyah Saulnier,cma   Activities of Daily Living     12/03/2023    2:35 PM  In your present state of health, do you have any difficulty performing the following activities:  Hearing? 0  Vision? 0  Difficulty concentrating or making decisions? 0  Walking or climbing stairs? 0  Dressing or bathing? 0  Doing errands, shopping? 0  Preparing Food and eating ? N  Using the Toilet? N  In the past six months, have you accidently leaked urine? N  Do you have problems with loss of bowel control? N  Managing your Medications? N  Managing your Finances? N  Housekeeping or managing your Housekeeping? N    Patient Care Team: Gretta Comer POUR, NP as PCP - General (Internal Medicine)  I have updated your Care Teams any recent Medical Services you may have received from other providers in the past year.     Assessment:   This is a routine wellness examination for [place].  Hearing/Vision screen Hearing Screening - Comments:: No difficulties  Vision Screening - Comments:: Patient wears OTC readers    Goals Addressed             This Visit's Progress    Patient Stated   On track    08/11/2021, stay active       Depression Screen     12/03/2023    2:36 PM 07/03/2023    9:41 AM 09/25/2022    3:51 PM 06/27/2022   11:13 AM 08/11/2021    3:18 PM 06/14/2021   10:30 AM 07/21/2020    9:48 AM  PHQ 2/9 Scores  PHQ - 2 Score 0 0 0 0 0 2 0  PHQ- 9 Score 0     3 0    Fall Risk     12/03/2023    2:34 PM 07/03/2023    9:41 AM 09/25/2022    3:58 PM 06/27/2022   11:13 AM 08/11/2021    3:18 PM  Fall Risk   Falls in the past year? 0 0 0 0 0  Number falls in past yr: 0 0 0 0 0  Injury with Fall? 0 0 0 0  0  Risk for fall due to : No Fall Risks No Fall Risks No Fall Risks No Fall Risks Medication side effect  Follow up Falls evaluation completed Falls evaluation completed Falls prevention discussed;Falls evaluation  completed Falls evaluation completed Falls evaluation completed;Education provided;Falls prevention discussed      Data saved with a previous flowsheet row definition    MEDICARE RISK AT HOME:  Medicare Risk at Home Any stairs in or around the home?: No If so, are there any without handrails?: No Home free of loose throw rugs in walkways, pet beds, electrical cords, etc?: Yes Adequate lighting in your home to reduce risk of falls?: Yes Life alert?: No Use of a cane, walker or w/c?: No Grab bars in the bathroom?: Yes Shower chair or bench in shower?: No Elevated toilet seat or a handicapped toilet?: No  TIMED UP AND GO:  Was the test performed?  no  Cognitive Function: 6CIT completed    07/21/2020    9:51 AM 04/07/2019   10:39 AM 03/28/2018   10:48 AM 03/27/2017   10:04 AM  MMSE - Mini Mental State Exam  Not completed: Refused     Orientation to time  5 5 5    Orientation to Place  5 5 5    Registration  3 3 3    Attention/ Calculation  5 0 0   Recall  3 3 3    Language- name 2 objects   0 0   Language- repeat  1 1 1   Language- follow 3 step command   3 3   Language- read & follow direction   0 0   Write a sentence   0 0   Copy design   0 0   Total score   20 20      Data saved with a previous flowsheet row definition        12/03/2023    2:36 PM 09/25/2022    4:01 PM 08/11/2021    3:20 PM  6CIT Screen  What Year? 0 points 0 points 0 points  What month? 0 points 0 points 0 points  What time? 0 points 0 points 0 points  Count back from 20 0 points 0 points 0 points  Months in reverse 0 points 0 points 0 points  Repeat phrase 0 points 0 points 0 points  Total Score 0 points 0 points 0 points    Immunizations Immunization History  Administered Date(s) Administered   Fluad Quad(high Dose 65+) 12/19/2018, 01/09/2020, 01/19/2021, 06/19/2023   Influenza,inj,Quad PF,6+ Mos 12/30/2015, 01/11/2017, 01/09/2018   Influenza-Unspecified 12/24/2014, 01/08/2022    PFIZER(Purple Top)SARS-COV-2 Vaccination 04/29/2019, 05/20/2019, 01/26/2020, 01/19/2021   PNEUMOCOCCAL CONJUGATE-20 07/03/2023   Pneumococcal Conjugate-13 02/22/2014   Pneumococcal Polysaccharide-23 01/14/2006, 02/06/2008   RSV,unspecified 02/07/2022   Respiratory Syncytial Virus Vaccine,Recomb Aduvanted(Arexvy) 02/22/2022   Tdap 02/17/2013, 06/19/2023   Zoster Recombinant(Shingrix) 05/09/2021, 07/26/2021   Zoster, Live 02/12/2011    Screening Tests Health Maintenance  Topic Date Due   COVID-19 Vaccine (5 - 2024-25 season) 12/09/2022   INFLUENZA VACCINE  11/08/2023   Medicare Annual Wellness (AWV)  12/02/2024   DEXA SCAN  08/05/2025   DTaP/Tdap/Td (3 - Td or Tdap) 06/18/2033   Pneumococcal Vaccine: 50+ Years  Completed   Zoster Vaccines- Shingrix  Completed   HPV VACCINES  Aged Out   Meningococcal B Vaccine  Aged Out   Hepatitis C Screening  Discontinued    Health Maintenance  Health Maintenance Due  Topic Date Due   COVID-19 Vaccine (  5 - 2024-25 season) 12/09/2022   INFLUENZA VACCINE  11/08/2023   Health Maintenance Items Addressed:patient declined vaccination   Additional Screening:  Vision Screening: Recommended annual ophthalmology exams for early detection of glaucoma and other disorders of the eye. Would you like a referral to an eye doctor? No    Dental Screening: Recommended annual dental exams for proper oral hygiene  Community Resource Referral / Chronic Care Management: CRR required this visit?  No   CCM required this visit?  No   Plan:    I have personally reviewed and noted the following in the patient's chart:   Medical and social history Use of alcohol, tobacco or illicit drugs  Current medications and supplements including opioid prescriptions. Patient is not currently taking opioid prescriptions. Functional ability and status Nutritional status Physical activity Advanced directives List of other physicians Hospitalizations, surgeries, and  ER visits in previous 12 months Vitals Screenings to include cognitive, depression, and falls Referrals and appointments  In addition, I have reviewed and discussed with patient certain preventive protocols, quality metrics, and best practice recommendations. A written personalized care plan for preventive services as well as general preventive health recommendations were provided to patient.   Lyle MARLA Right, [place]   12/03/2023   After Visit Summary: (MyChart) Due to this being a telephonic visit, the after visit summary with patients personalized plan was offered to patient via MyChart   Notes: Nothing significant to report at this time.

## 2023-12-03 NOTE — Patient Instructions (Signed)
 Rachel Bowers , Thank you for taking time out of your busy schedule to complete your Annual Wellness Visit with me. I enjoyed our conversation and look forward to speaking with you again next year. I, as well as your care team,  appreciate your ongoing commitment to your health goals. Please review the following plan we discussed and let me know if I can assist you in the future. Your Game plan/ To Do List    Referrals: If you haven't heard from the office you've been referred to, please reach out to them at the phone provided.   Follow up Visits: We will see or speak with you next year for your Next Medicare AWV with our clinical staff Have you seen your provider in the last 6 months (3 months if uncontrolled diabetes)? Yes  Clinician Recommendations:  Aim for 30 minutes of exercise or brisk walking, 6-8 glasses of water, and 5 servings of fruits and vegetables each day.       This is a list of the screenings recommended for you:  Health Maintenance  Topic Date Due   COVID-19 Vaccine (5 - 2024-25 season) 12/09/2022   Flu Shot  11/08/2023   Medicare Annual Wellness Visit  12/02/2024   DEXA scan (bone density measurement)  08/05/2025   DTaP/Tdap/Td vaccine (3 - Td or Tdap) 06/18/2033   Pneumococcal Vaccine for age over 39  Completed   Zoster (Shingles) Vaccine  Completed   HPV Vaccine  Aged Out   Meningitis B Vaccine  Aged Out   Hepatitis C Screening  Discontinued    Advanced directives: (Declined) Advance directive discussed with you today. Even though you declined this today, please call our office should you change your mind, and we can give you the proper paperwork for you to fill out. Advance Care Planning is important because it:  [x]  Makes sure you receive the medical care that is consistent with your values, goals, and preferences  [x]  It provides guidance to your family and loved ones and reduces their decisional burden about whether or not they are making the right decisions  based on your wishes.  Follow the link provided in your after visit summary or read over the paperwork we have mailed to you to help you started getting your Advance Directives in place. If you need assistance in completing these, please reach out to us  so that we can help you!  See attachments for Preventive Care and Fall Prevention Tips.

## 2023-12-31 ENCOUNTER — Ambulatory Visit (INDEPENDENT_AMBULATORY_CARE_PROVIDER_SITE_OTHER)
Admission: RE | Admit: 2023-12-31 | Discharge: 2023-12-31 | Disposition: A | Discharge status: Home / Self Care | Source: Ambulatory Visit | Attending: Nurse Practitioner | Admitting: Nurse Practitioner

## 2023-12-31 ENCOUNTER — Ambulatory Visit: Admitting: Nurse Practitioner

## 2023-12-31 VITALS — BP 120/60 | HR 74 | Temp 97.9°F | Ht 67.0 in | Wt 142.0 lb

## 2023-12-31 DIAGNOSIS — R5383 Other fatigue: Secondary | ICD-10-CM | POA: Diagnosis not present

## 2023-12-31 DIAGNOSIS — R1032 Left lower quadrant pain: Secondary | ICD-10-CM

## 2023-12-31 DIAGNOSIS — K5900 Constipation, unspecified: Secondary | ICD-10-CM | POA: Diagnosis not present

## 2023-12-31 DIAGNOSIS — I878 Other specified disorders of veins: Secondary | ICD-10-CM | POA: Diagnosis not present

## 2023-12-31 DIAGNOSIS — R52 Pain, unspecified: Secondary | ICD-10-CM | POA: Diagnosis not present

## 2023-12-31 DIAGNOSIS — R509 Fever, unspecified: Secondary | ICD-10-CM | POA: Diagnosis not present

## 2023-12-31 LAB — COMPREHENSIVE METABOLIC PANEL WITH GFR
ALT: 28 U/L (ref 0–35)
AST: 19 U/L (ref 0–37)
Albumin: 4.2 g/dL (ref 3.5–5.2)
Alkaline Phosphatase: 88 U/L (ref 39–117)
BUN: 15 mg/dL (ref 6–23)
CO2: 30 meq/L (ref 19–32)
Calcium: 9.5 mg/dL (ref 8.4–10.5)
Chloride: 99 meq/L (ref 96–112)
Creatinine, Ser: 0.77 mg/dL (ref 0.40–1.20)
GFR: 71.54 mL/min (ref >60.00)
Glucose, Bld: 101 mg/dL — ABNORMAL HIGH (ref 70–99)
Potassium: 4 meq/L (ref 3.5–5.1)
Sodium: 137 meq/L (ref 135–145)
Total Bilirubin: 1.1 mg/dL (ref 0.2–1.2)
Total Protein: 6.9 g/dL (ref 6.0–8.3)

## 2023-12-31 LAB — CBC WITH DIFFERENTIAL/PLATELET
Basophils Absolute: 0 K/uL (ref 0.0–0.1)
Basophils Relative: 0.3 % (ref 0.0–3.0)
Eosinophils Absolute: 0.1 K/uL (ref 0.0–0.7)
Eosinophils Relative: 1.1 % (ref 0.0–5.0)
HCT: 36.5 % (ref 36.0–46.0)
Hemoglobin: 12.3 g/dL (ref 12.0–15.0)
Lymphocytes Relative: 30.9 % (ref 12.0–46.0)
Lymphs Abs: 2.6 K/uL (ref 0.7–4.0)
MCHC: 33.8 g/dL (ref 30.0–36.0)
MCV: 90.9 fl (ref 78.0–100.0)
Monocytes Absolute: 0.7 K/uL (ref 0.1–1.0)
Monocytes Relative: 7.7 % (ref 3.0–12.0)
Neutro Abs: 5.1 K/uL (ref 1.4–7.7)
Neutrophils Relative %: 60 % (ref 43.0–77.0)
Platelets: 351 K/uL (ref 150.0–400.0)
RBC: 4.02 Mil/uL (ref 3.87–5.11)
RDW: 13.4 % (ref 11.5–15.5)
WBC: 8.5 K/uL (ref 4.0–10.5)

## 2023-12-31 LAB — POCT URINALYSIS DIPSTICK
Bilirubin, UA: NEGATIVE
Blood, UA: NEGATIVE
Glucose, UA: NEGATIVE
Ketones, UA: NEGATIVE
Leukocytes, UA: NEGATIVE
Nitrite, UA: NEGATIVE
Protein, UA: NEGATIVE
Spec Grav, UA: 1.01 (ref 1.010–1.025)
Urobilinogen, UA: 2 U/dL — AB
pH, UA: 7 (ref 5.0–8.0)

## 2023-12-31 LAB — CK: Total CK: 65 U/L (ref 17–177)

## 2023-12-31 LAB — VITAMIN B12: Vitamin B-12: 402 pg/mL (ref 211–911)

## 2023-12-31 LAB — VITAMIN D 25 HYDROXY (VIT D DEFICIENCY, FRACTURES): VITD: 58.83 ng/mL (ref 30.00–100.00)

## 2023-12-31 LAB — POCT FLU A/B STATUS
Influenza A, POC: NEGATIVE
Influenza B, POC: NEGATIVE

## 2023-12-31 NOTE — Patient Instructions (Signed)
 Nice to see you today  Flu test and urine test were negative today I will be in touch with the labs and xray once I have them Follow up if you do not improve or as needed

## 2023-12-31 NOTE — Progress Notes (Signed)
 Acute Office Visit  Subjective:     Patient ID: Rachel Bowers, female    DOB: 1940/09/29, 83 y.o.   MRN: [id number]  Chief Complaint  Patient presents with   lack of energy    Pt complains of having no energy since last Thursday. Pt states she is also constipated with lower abdominal pain. Last BM was Saturday.     HPI  with a history of HTN, OA, vitamin D  def, HLD, pre diabetes, chronic pain  Discussed the use of AI scribe software for clinical note transcription with the patient, who gave verbal consent to proceed.  History of Present Illness Rachel Bowers is an 83 year old female who presents with fever, chills, fatigue, and abdominal pain.  She began experiencing chills and fatigue on Thursday afternoon, which persisted through the weekend. On Saturday morning, she felt as though she was in a sauna and was soaking wet. She has not been around anyone sick recently and has not received her flu vaccine this season. She has received COVID vaccines in the past and took a COVID test, which was negative.  On Sunday, she was unable to attend church due to feeling unwell but went out for dinner with her son in the afternoon. On Monday, she went shopping and out for lunch with a friend but felt very tired afterward. On Tuesday morning, she experienced significant abdominal pain and realized she had been constipated for at least three days. Her last bowel movement was over three days ago, although she typically has daily bowel movements. She drinks about one and a half large Yeti bottles of water daily and occasionally takes a spoonful of honey with a little whiskey at night to help with sleep.  She reports body aches, particularly in her legs, which began on Thursday. 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 She has not felt able to resume her previous level of activity. She sometimes experiences leg cramps,  which she treats with mustard, and wonders if they are due to insufficient water intake. She lives alone and does not cook often, which affects her diet.  Her current medications include irbesartan  for blood pressure, amlodipine , aspirin, and meloxicam  as needed for pain. She has not taken meloxicam  recently. No ear pain, sore throat, cough, shortness of breath, chest pain, nausea, vomiting, diarrhea, lightheadedness, dizziness, or headache. She feels fatigued but not weak, and she can still perform daily activities but feels the need to rest afterward.   Review of Systems  Constitutional:  Positive for chills, fever and malaise/fatigue.  HENT:  Negative for ear discharge, ear pain and sore throat.   Respiratory:  Negative for cough and shortness of breath.   Cardiovascular:  Negative for chest pain.  Gastrointestinal:  Positive for abdominal pain and constipation. Negative for nausea and vomiting.  Neurological:  Negative for dizziness and headaches.        Objective:    BP 120/60   Pulse 74   Temp 97.9 F (36.6 C) (Oral)   Ht 5' 7 (1.702 m)   Wt 142 lb (64.4 kg)   SpO2 95%   BMI 22.24 kg/m  BP Readings from Last 3 Encounters:  12/31/23 120/60  12/03/23 132/77  09/23/23 132/77   Wt Readings from Last 3 Encounters:  12/31/23 142 lb (64.4 kg)  12/03/23 143 lb (64.9 kg)  07/03/23 151 lb (68.5 kg)  SpO2 Readings from Last 3 Encounters:  12/31/23 95%  09/23/23 96%  07/03/23 96%      Physical Exam Vitals and nursing note reviewed.  Constitutional:      Appearance: Normal appearance.  HENT:     Right Ear: Tympanic membrane, ear canal and external ear normal.     Left Ear: Tympanic membrane, ear canal and external ear normal.     Mouth/Throat:     Mouth: Mucous membranes are moist.     Pharynx: Oropharynx is clear.  Cardiovascular:     Rate and Rhythm: Normal rate and regular rhythm.     Heart sounds: Normal heart sounds.  Pulmonary:     Effort: Pulmonary effort  is normal.     Breath sounds: Normal breath sounds.  Abdominal:     General: Bowel sounds are normal. There is no distension.     Palpations: There is no mass.     Tenderness: There is abdominal tenderness in the left lower quadrant.     Hernia: No hernia is present.  Lymphadenopathy:     Cervical: No cervical adenopathy.  Neurological:     Mental Status: She is alert.     Results for orders placed or performed in visit on 12/31/23  POCT Flu A & B Status  Result Value Ref Range   Influenza A, POC Negative Negative   Influenza B, POC Negative Negative  POCT urinalysis dipstick  Result Value Ref Range   Color, UA yellow    Clarity, UA clear    Glucose, UA Negative Negative   Bilirubin, UA neg    Ketones, UA neg    Spec Grav, UA 1.010 1.010 - 1.025   Blood, UA neg    pH, UA 7.0 5.0 - 8.0   Protein, UA Negative Negative   Urobilinogen, UA 2.0 (A) 0.2 or 1.0 E.U./dL   Nitrite, UA neg    Leukocytes, UA Negative Negative   Appearance     Odor          Assessment & Plan:   Problem List Items Addressed This Visit   None Visit Diagnoses       Fever and chills    -  Primary   Relevant Orders   POCT Flu A & B Status (Completed)     Body aches       Relevant Orders   POCT Flu A & B Status (Completed)   CK     LLQ abdominal pain       Relevant Orders   CBC with Differential/Platelet   Comprehensive metabolic panel with GFR   DG Abd 1 View (Completed)   POCT urinalysis dipstick (Completed)     Fatigue, unspecified type       Relevant Orders   VITAMIN D  25 Hydroxy (Vit-D Deficiency, Fractures)   Vitamin B12     Assessment and Plan Assessment & Plan Acute constipation with abdominal pain and leg cramps Constipation with abdominal pain and leg cramps. Differential includes constipation versus diverticulitis. No history of diverticulitis. Metamucil ineffective. Possible inadequate fluid intake. - Order blood work to assess underlying causes. - Order abdominal x-ray  to evaluate stool burden. - Consider CT scan if x-ray and blood work inconclusive. - Advise increased fluid intake.  Acute fatigue, malaise, fever, chills, and myalgia Symptoms suggest viral infection. Negative COVID test. Urinalysis normal. Flu test pending. - Perform flu test. - Order blood work to assess underlying causes. - Monitor flu test results and proceed with further diagnostics  if necessary.  Hypertension, on irbesartan  and amlodipine   Hyperlipidemia, on statin therapy Hyperlipidemia managed with lovastatin .  General Health Maintenance No flu vaccine this season. COVID vaccination history noted. - Administer flu vaccine when available. - Administer COVID vaccine when available.   No orders of the defined types were placed in this encounter.   Return if symptoms worsen or fail to improve.  Adina Crandall, NP

## 2024-01-01 ENCOUNTER — Ambulatory Visit: Payer: Self-pay | Admitting: Nurse Practitioner

## 2024-01-03 ENCOUNTER — Other Ambulatory Visit: Payer: Self-pay | Admitting: Primary Care

## 2024-01-03 DIAGNOSIS — I10 Essential (primary) hypertension: Secondary | ICD-10-CM

## 2024-01-03 DIAGNOSIS — E785 Hyperlipidemia, unspecified: Secondary | ICD-10-CM

## 2024-01-03 MED ORDER — AMLODIPINE BESYLATE 5 MG PO TABS: 5.0000 mg | ORAL_TABLET | Freq: Every day | ORAL | 1 refills | Status: AC

## 2024-01-03 MED ORDER — LOVASTATIN 20 MG PO TABS: 20.0000 mg | ORAL_TABLET | Freq: Every day | ORAL | 1 refills | Status: AC

## 2024-01-03 NOTE — Telephone Encounter (Unsigned)
 Copied from CRM #[id number]. Topic: Clinical - Medication Refill >> Jan 03, 2024  9:47 AM Franky GRADE wrote: Medication: lovastatin  (MEVACOR ) 20 MG tablet [[id number]] &  amLODipine  (NORVASC ) 5 MG tablet [[id number]]  Has the patient contacted their pharmacy? Yes, they require a new prescription.  (Agent: If no, request that the patient contact the pharmacy for the refill. If patient does not wish to contact the pharmacy document the reason why and proceed with request.) (Agent: If yes, when and what did the pharmacy advise?)  This is the patient's preferred pharmacy:  [facility] - [place], [place] - [id number] [place] [address] Phone: [phone] Fax: [phone]  Is this the correct pharmacy for this prescription? Yes If no, delete pharmacy and type the correct one.   Has the prescription been filled recently? No  Is the patient out of the medication? No  Has the patient been seen for an appointment in the last year OR does the patient have an upcoming appointment? Yes  Can we respond through MyChart? Yes  Agent: Please be advised that Rx refills may take up to 3 business days. We ask that you follow-up with your pharmacy.

## 2024-01-17 ENCOUNTER — Other Ambulatory Visit: Payer: Self-pay

## 2024-01-17 MED ORDER — COMIRNATY 30 MCG/0.3ML IM SUSY
0.3000 mL | PREFILLED_SYRINGE | Freq: Once | INTRAMUSCULAR | 0 refills | Status: AC
  Filled 2024-01-17: qty 0.3, 1d supply, fill #0

## 2024-01-17 MED ORDER — FLUZONE HIGH-DOSE 0.5 ML IM SUSY
0.5000 mL | PREFILLED_SYRINGE | Freq: Once | INTRAMUSCULAR | 0 refills | Status: AC
  Filled 2024-01-17: qty 0.5, 1d supply, fill #0

## 2024-04-22 ENCOUNTER — Other Ambulatory Visit: Payer: Self-pay | Admitting: Primary Care

## 2024-04-22 DIAGNOSIS — I10 Essential (primary) hypertension: Secondary | ICD-10-CM

## 2024-07-03 ENCOUNTER — Encounter: Admitting: Primary Care

## 2024-11-30 ENCOUNTER — Ambulatory Visit

## 2024-12-03 ENCOUNTER — Ambulatory Visit
